# Patient Record
Sex: Female | Born: 1980 | Race: Black or African American | Hispanic: No | Marital: Single | Smoking: Never smoker
Health system: Southern US, Community
[De-identification: ages and names within clinical notes are randomized; demographics above are authoritative.]

## PROBLEM LIST (undated history)

## (undated) ENCOUNTER — Inpatient Hospital Stay (HOSPITAL_COMMUNITY): Payer: Self-pay

## (undated) DIAGNOSIS — D649 Anemia, unspecified: Secondary | ICD-10-CM

## (undated) DIAGNOSIS — D219 Benign neoplasm of connective and other soft tissue, unspecified: Secondary | ICD-10-CM

## (undated) DIAGNOSIS — R011 Cardiac murmur, unspecified: Secondary | ICD-10-CM

## (undated) DIAGNOSIS — O09219 Supervision of pregnancy with history of pre-term labor, unspecified trimester: Secondary | ICD-10-CM

## (undated) DIAGNOSIS — O24419 Gestational diabetes mellitus in pregnancy, unspecified control: Secondary | ICD-10-CM

## (undated) DIAGNOSIS — E119 Type 2 diabetes mellitus without complications: Secondary | ICD-10-CM

## (undated) DIAGNOSIS — I10 Essential (primary) hypertension: Secondary | ICD-10-CM

## (undated) HISTORY — DX: Benign neoplasm of connective and other soft tissue, unspecified: D21.9

## (undated) HISTORY — DX: Supervision of pregnancy with history of pre-term labor, unspecified trimester: O09.219

## (undated) HISTORY — DX: Anemia, unspecified: D64.9

## (undated) HISTORY — PX: NO PAST SURGERIES: SHX2092

## (undated) HISTORY — DX: Cardiac murmur, unspecified: R01.1

## (undated) HISTORY — DX: Morbid (severe) obesity due to excess calories: E66.01

---

## 2003-08-15 ENCOUNTER — Emergency Department (HOSPITAL_COMMUNITY): Admission: EM | Admit: 2003-08-15 | Discharge: 2003-08-16 | Payer: Self-pay

## 2003-09-08 ENCOUNTER — Inpatient Hospital Stay (HOSPITAL_COMMUNITY): Admission: AD | Admit: 2003-09-08 | Discharge: 2003-09-08 | Payer: Self-pay | Admitting: *Deleted

## 2003-09-22 ENCOUNTER — Inpatient Hospital Stay (HOSPITAL_COMMUNITY): Admission: AD | Admit: 2003-09-22 | Discharge: 2003-09-22 | Payer: Self-pay | Admitting: *Deleted

## 2003-09-23 ENCOUNTER — Inpatient Hospital Stay (HOSPITAL_COMMUNITY): Admission: AD | Admit: 2003-09-23 | Discharge: 2003-09-24 | Payer: Self-pay | Admitting: Obstetrics and Gynecology

## 2003-09-24 ENCOUNTER — Inpatient Hospital Stay (HOSPITAL_COMMUNITY): Admission: AD | Admit: 2003-09-24 | Discharge: 2003-09-24 | Payer: Self-pay | Admitting: Obstetrics and Gynecology

## 2003-10-02 ENCOUNTER — Inpatient Hospital Stay (HOSPITAL_COMMUNITY): Admission: RE | Admit: 2003-10-02 | Discharge: 2003-10-02 | Payer: Self-pay | Admitting: Obstetrics and Gynecology

## 2003-10-23 ENCOUNTER — Inpatient Hospital Stay (HOSPITAL_COMMUNITY): Admission: AD | Admit: 2003-10-23 | Discharge: 2003-10-23 | Payer: Self-pay | Admitting: Obstetrics & Gynecology

## 2004-01-07 ENCOUNTER — Inpatient Hospital Stay (HOSPITAL_COMMUNITY): Admission: AD | Admit: 2004-01-07 | Discharge: 2004-01-07 | Payer: Self-pay | Admitting: Obstetrics

## 2004-02-03 ENCOUNTER — Inpatient Hospital Stay (HOSPITAL_COMMUNITY): Admission: AD | Admit: 2004-02-03 | Discharge: 2004-02-03 | Payer: Self-pay | Admitting: Obstetrics

## 2004-02-07 ENCOUNTER — Inpatient Hospital Stay (HOSPITAL_COMMUNITY): Admission: AD | Admit: 2004-02-07 | Discharge: 2004-02-07 | Payer: Self-pay | Admitting: Obstetrics

## 2004-03-02 ENCOUNTER — Inpatient Hospital Stay (HOSPITAL_COMMUNITY): Admission: AD | Admit: 2004-03-02 | Discharge: 2004-03-02 | Payer: Self-pay | Admitting: Obstetrics

## 2004-04-19 ENCOUNTER — Inpatient Hospital Stay (HOSPITAL_COMMUNITY): Admission: AD | Admit: 2004-04-19 | Discharge: 2004-04-20 | Payer: Self-pay | Admitting: Obstetrics

## 2004-04-30 ENCOUNTER — Inpatient Hospital Stay (HOSPITAL_COMMUNITY): Admission: AD | Admit: 2004-04-30 | Discharge: 2004-05-02 | Payer: Self-pay | Admitting: Obstetrics

## 2004-05-04 ENCOUNTER — Inpatient Hospital Stay (HOSPITAL_COMMUNITY): Admission: AD | Admit: 2004-05-04 | Discharge: 2004-05-04 | Payer: Self-pay | Admitting: Obstetrics

## 2004-05-13 ENCOUNTER — Inpatient Hospital Stay (HOSPITAL_COMMUNITY): Admission: AD | Admit: 2004-05-13 | Discharge: 2004-05-15 | Payer: Self-pay | Admitting: *Deleted

## 2006-07-29 ENCOUNTER — Inpatient Hospital Stay (HOSPITAL_COMMUNITY): Admission: AD | Admit: 2006-07-29 | Discharge: 2006-07-29 | Payer: Self-pay | Admitting: Obstetrics and Gynecology

## 2006-08-03 ENCOUNTER — Inpatient Hospital Stay (HOSPITAL_COMMUNITY): Admission: AD | Admit: 2006-08-03 | Discharge: 2006-08-03 | Payer: Self-pay | Admitting: Obstetrics & Gynecology

## 2007-01-03 ENCOUNTER — Inpatient Hospital Stay (HOSPITAL_COMMUNITY): Admission: AD | Admit: 2007-01-03 | Discharge: 2007-01-19 | Payer: Self-pay | Admitting: Obstetrics

## 2007-01-31 ENCOUNTER — Inpatient Hospital Stay (HOSPITAL_COMMUNITY): Admission: AD | Admit: 2007-01-31 | Discharge: 2007-02-02 | Payer: Self-pay | Admitting: Obstetrics & Gynecology

## 2007-01-31 ENCOUNTER — Encounter: Payer: Self-pay | Admitting: Obstetrics & Gynecology

## 2007-07-02 ENCOUNTER — Emergency Department (HOSPITAL_COMMUNITY): Admission: EM | Admit: 2007-07-02 | Discharge: 2007-07-02 | Payer: Self-pay | Admitting: Emergency Medicine

## 2007-10-23 ENCOUNTER — Emergency Department (HOSPITAL_COMMUNITY): Admission: EM | Admit: 2007-10-23 | Discharge: 2007-10-24 | Payer: Self-pay | Admitting: Emergency Medicine

## 2007-11-18 ENCOUNTER — Emergency Department (HOSPITAL_COMMUNITY): Admission: EM | Admit: 2007-11-18 | Discharge: 2007-11-18 | Payer: Self-pay | Admitting: Emergency Medicine

## 2008-04-30 ENCOUNTER — Inpatient Hospital Stay (HOSPITAL_COMMUNITY): Admission: AD | Admit: 2008-04-30 | Discharge: 2008-04-30 | Payer: Self-pay | Admitting: Family Medicine

## 2008-06-11 ENCOUNTER — Ambulatory Visit: Payer: Self-pay | Admitting: Obstetrics and Gynecology

## 2008-06-25 ENCOUNTER — Emergency Department (HOSPITAL_COMMUNITY): Admission: EM | Admit: 2008-06-25 | Discharge: 2008-06-25 | Payer: Self-pay | Admitting: Emergency Medicine

## 2008-07-09 ENCOUNTER — Inpatient Hospital Stay (HOSPITAL_COMMUNITY): Admission: AD | Admit: 2008-07-09 | Discharge: 2008-07-09 | Payer: Self-pay | Admitting: Obstetrics & Gynecology

## 2008-08-08 ENCOUNTER — Inpatient Hospital Stay (HOSPITAL_COMMUNITY): Admission: AD | Admit: 2008-08-08 | Discharge: 2008-08-08 | Payer: Self-pay | Admitting: Obstetrics

## 2008-11-03 ENCOUNTER — Inpatient Hospital Stay (HOSPITAL_COMMUNITY): Admission: AD | Admit: 2008-11-03 | Discharge: 2008-11-04 | Payer: Self-pay | Admitting: Obstetrics

## 2008-11-26 ENCOUNTER — Inpatient Hospital Stay (HOSPITAL_COMMUNITY): Admission: AD | Admit: 2008-11-26 | Discharge: 2008-11-29 | Payer: Self-pay | Admitting: Obstetrics

## 2008-11-26 ENCOUNTER — Encounter: Payer: Self-pay | Admitting: Obstetrics

## 2008-12-29 ENCOUNTER — Inpatient Hospital Stay (HOSPITAL_COMMUNITY): Admission: AD | Admit: 2008-12-29 | Discharge: 2008-12-29 | Payer: Self-pay | Admitting: Obstetrics

## 2009-01-01 ENCOUNTER — Ambulatory Visit (HOSPITAL_COMMUNITY): Admission: RE | Admit: 2009-01-01 | Discharge: 2009-01-01 | Payer: Self-pay | Admitting: Obstetrics & Gynecology

## 2009-01-29 ENCOUNTER — Ambulatory Visit (HOSPITAL_COMMUNITY): Admission: RE | Admit: 2009-01-29 | Discharge: 2009-01-29 | Payer: Self-pay | Admitting: Obstetrics & Gynecology

## 2009-02-01 ENCOUNTER — Inpatient Hospital Stay (HOSPITAL_COMMUNITY): Admission: AD | Admit: 2009-02-01 | Discharge: 2009-02-04 | Payer: Self-pay | Admitting: Obstetrics

## 2009-02-09 ENCOUNTER — Inpatient Hospital Stay (HOSPITAL_COMMUNITY): Admission: AD | Admit: 2009-02-09 | Discharge: 2009-02-09 | Payer: Self-pay | Admitting: Obstetrics & Gynecology

## 2009-02-11 ENCOUNTER — Ambulatory Visit (HOSPITAL_COMMUNITY): Admission: RE | Admit: 2009-02-11 | Discharge: 2009-02-11 | Payer: Self-pay | Admitting: Obstetrics & Gynecology

## 2009-02-15 ENCOUNTER — Inpatient Hospital Stay (HOSPITAL_COMMUNITY): Admission: AD | Admit: 2009-02-15 | Discharge: 2009-02-19 | Payer: Self-pay | Admitting: Obstetrics & Gynecology

## 2009-02-16 ENCOUNTER — Encounter: Payer: Self-pay | Admitting: Obstetrics & Gynecology

## 2009-07-28 ENCOUNTER — Ambulatory Visit: Payer: Self-pay | Admitting: Family Medicine

## 2009-07-28 DIAGNOSIS — R143 Flatulence: Secondary | ICD-10-CM

## 2009-07-28 DIAGNOSIS — R142 Eructation: Secondary | ICD-10-CM

## 2009-07-28 DIAGNOSIS — D259 Leiomyoma of uterus, unspecified: Secondary | ICD-10-CM | POA: Insufficient documentation

## 2009-07-28 DIAGNOSIS — K047 Periapical abscess without sinus: Secondary | ICD-10-CM

## 2009-07-28 DIAGNOSIS — R141 Gas pain: Secondary | ICD-10-CM

## 2009-07-28 DIAGNOSIS — L910 Hypertrophic scar: Secondary | ICD-10-CM | POA: Insufficient documentation

## 2009-07-28 DIAGNOSIS — J309 Allergic rhinitis, unspecified: Secondary | ICD-10-CM | POA: Insufficient documentation

## 2009-07-28 DIAGNOSIS — R109 Unspecified abdominal pain: Secondary | ICD-10-CM | POA: Insufficient documentation

## 2009-07-28 DIAGNOSIS — I10 Essential (primary) hypertension: Secondary | ICD-10-CM | POA: Insufficient documentation

## 2009-07-29 ENCOUNTER — Encounter (INDEPENDENT_AMBULATORY_CARE_PROVIDER_SITE_OTHER): Payer: Self-pay | Admitting: Family Medicine

## 2009-08-20 ENCOUNTER — Ambulatory Visit: Payer: Self-pay | Admitting: Family Medicine

## 2009-08-20 LAB — CONVERTED CEMR LAB
Total CHOL/HDL Ratio: 4.5
VLDL: 28 mg/dL (ref 0–40)

## 2009-08-27 ENCOUNTER — Ambulatory Visit: Payer: Self-pay | Admitting: Family Medicine

## 2009-08-27 DIAGNOSIS — K089 Disorder of teeth and supporting structures, unspecified: Secondary | ICD-10-CM | POA: Insufficient documentation

## 2009-08-27 DIAGNOSIS — L259 Unspecified contact dermatitis, unspecified cause: Secondary | ICD-10-CM

## 2009-08-27 LAB — CONVERTED CEMR LAB: Hgb A1c MFr Bld: 5.6 %

## 2009-09-09 ENCOUNTER — Ambulatory Visit (HOSPITAL_COMMUNITY): Admission: RE | Admit: 2009-09-09 | Discharge: 2009-09-09 | Payer: Self-pay | Admitting: Family Medicine

## 2009-09-09 ENCOUNTER — Encounter (INDEPENDENT_AMBULATORY_CARE_PROVIDER_SITE_OTHER): Payer: Self-pay | Admitting: Family Medicine

## 2009-09-14 ENCOUNTER — Ambulatory Visit: Payer: Self-pay | Admitting: Internal Medicine

## 2009-10-20 ENCOUNTER — Ambulatory Visit: Payer: Self-pay | Admitting: Obstetrics & Gynecology

## 2009-10-20 ENCOUNTER — Encounter: Payer: Self-pay | Admitting: Family

## 2009-10-20 LAB — CONVERTED CEMR LAB
Platelets: 319 10*3/uL (ref 150–400)
TSH: 1.357 microintl units/mL (ref 0.350–4.500)
WBC: 6.1 10*3/uL (ref 4.0–10.5)

## 2009-10-21 ENCOUNTER — Encounter: Payer: Self-pay | Admitting: Physician Assistant

## 2009-10-28 ENCOUNTER — Ambulatory Visit: Payer: Self-pay | Admitting: Family Medicine

## 2009-10-29 ENCOUNTER — Ambulatory Visit (HOSPITAL_COMMUNITY): Admission: RE | Admit: 2009-10-29 | Discharge: 2009-10-29 | Payer: Self-pay | Admitting: Obstetrics & Gynecology

## 2009-11-19 ENCOUNTER — Ambulatory Visit: Payer: Self-pay | Admitting: Family Medicine

## 2009-12-04 ENCOUNTER — Emergency Department (HOSPITAL_COMMUNITY): Admission: EM | Admit: 2009-12-04 | Discharge: 2009-12-04 | Payer: Self-pay | Admitting: Emergency Medicine

## 2010-02-07 ENCOUNTER — Emergency Department (HOSPITAL_COMMUNITY): Admission: EM | Admit: 2010-02-07 | Discharge: 2010-02-07 | Payer: Self-pay | Admitting: Emergency Medicine

## 2010-08-09 ENCOUNTER — Emergency Department (HOSPITAL_COMMUNITY)
Admission: EM | Admit: 2010-08-09 | Discharge: 2010-08-10 | Payer: Self-pay | Source: Home / Self Care | Admitting: Emergency Medicine

## 2010-10-02 ENCOUNTER — Encounter: Payer: Self-pay | Admitting: *Deleted

## 2010-10-03 ENCOUNTER — Encounter: Payer: Self-pay | Admitting: Obstetrics

## 2010-10-11 NOTE — Letter (Signed)
Summary: DENTAL REFERRAL  DENTAL REFERRAL   Imported By: Arta Bruce 10/15/2009 14:49:21  _____________________________________________________________________  External Attachment:    Type:   Image     Comment:   External Document

## 2010-10-11 NOTE — Letter (Signed)
Summary: REFERRAL/DERMATOLOGY//APPT DATE & TIME  REFERRAL/DERMATOLOGY//APPT DATE & TIME   Imported By: Arta Bruce 10/12/2009 12:33:23  _____________________________________________________________________  External Attachment:    Type:   Image     Comment:   External Document

## 2010-11-22 LAB — URINE MICROSCOPIC-ADD ON

## 2010-11-22 LAB — URINALYSIS, ROUTINE W REFLEX MICROSCOPIC
Leukocytes, UA: NEGATIVE
Nitrite: NEGATIVE
Specific Gravity, Urine: 1.028 (ref 1.005–1.030)
Urobilinogen, UA: 0.2 mg/dL (ref 0.0–1.0)

## 2010-11-22 LAB — POCT I-STAT, CHEM 8
Creatinine, Ser: 0.6 mg/dL (ref 0.4–1.2)
Glucose, Bld: 99 mg/dL (ref 70–99)
Hemoglobin: 13.6 g/dL (ref 12.0–15.0)
Potassium: 3.7 mEq/L (ref 3.5–5.1)
TCO2: 25 mmol/L (ref 0–100)

## 2010-12-04 LAB — URINALYSIS, ROUTINE W REFLEX MICROSCOPIC
Glucose, UA: NEGATIVE mg/dL
Protein, ur: NEGATIVE mg/dL

## 2010-12-04 LAB — URINE MICROSCOPIC-ADD ON

## 2010-12-04 LAB — POCT PREGNANCY, URINE: Preg Test, Ur: NEGATIVE

## 2010-12-19 LAB — CBC
HCT: 35.1 % — ABNORMAL LOW (ref 36.0–46.0)
HCT: 36.2 % (ref 36.0–46.0)
HCT: 37.8 % (ref 36.0–46.0)
Hemoglobin: 12.6 g/dL (ref 12.0–15.0)
MCHC: 33 g/dL (ref 30.0–36.0)
MCHC: 33.2 g/dL (ref 30.0–36.0)
Platelets: 216 10*3/uL (ref 150–400)
Platelets: 220 10*3/uL (ref 150–400)
RDW: 16.1 % — ABNORMAL HIGH (ref 11.5–15.5)
RDW: 16.6 % — ABNORMAL HIGH (ref 11.5–15.5)
WBC: 7.8 10*3/uL (ref 4.0–10.5)

## 2010-12-19 LAB — COMPREHENSIVE METABOLIC PANEL
ALT: 23 U/L (ref 0–35)
ALT: 29 U/L (ref 0–35)
AST: 29 U/L (ref 0–37)
Albumin: 2.5 g/dL — ABNORMAL LOW (ref 3.5–5.2)
Albumin: 2.6 g/dL — ABNORMAL LOW (ref 3.5–5.2)
Alkaline Phosphatase: 70 U/L (ref 39–117)
Alkaline Phosphatase: 71 U/L (ref 39–117)
Alkaline Phosphatase: 83 U/L (ref 39–117)
BUN: 6 mg/dL (ref 6–23)
Calcium: 9 mg/dL (ref 8.4–10.5)
Chloride: 106 mEq/L (ref 96–112)
GFR calc Af Amer: >60 mL/min (ref >60)
GFR calc Af Amer: >60 mL/min (ref >60)
GFR calc non Af Amer: >60 mL/min (ref >60)
Glucose, Bld: 81 mg/dL (ref 70–99)
Potassium: 3.5 mEq/L (ref 3.5–5.1)
Potassium: 3.7 mEq/L (ref 3.5–5.1)
Sodium: 133 mEq/L — ABNORMAL LOW (ref 135–145)
Total Bilirubin: 0.4 mg/dL (ref 0.3–1.2)
Total Protein: 5.6 g/dL — ABNORMAL LOW (ref 6.0–8.3)
Total Protein: 5.8 g/dL — ABNORMAL LOW (ref 6.0–8.3)

## 2010-12-19 LAB — GLUCOSE, CAPILLARY
Glucose-Capillary: 100 mg/dL — ABNORMAL HIGH (ref 70–99)
Glucose-Capillary: 106 mg/dL — ABNORMAL HIGH (ref 70–99)

## 2010-12-19 LAB — MAGNESIUM: Magnesium: 3.6 mg/dL — ABNORMAL HIGH (ref 1.5–2.5)

## 2010-12-19 LAB — LACTATE DEHYDROGENASE: LDH: 125 U/L (ref 94–250)

## 2010-12-19 LAB — RPR: RPR Ser Ql: NONREACTIVE

## 2010-12-20 LAB — CBC
Hemoglobin: 12.3 g/dL (ref 12.0–15.0)
MCHC: 32.7 g/dL (ref 30.0–36.0)
MCV: 76.2 fL — ABNORMAL LOW (ref 78.0–100.0)
RBC: 4.92 MIL/uL (ref 3.87–5.11)

## 2010-12-20 LAB — COMPREHENSIVE METABOLIC PANEL
ALT: 25 U/L (ref 0–35)
Alkaline Phosphatase: 70 U/L (ref 39–117)
CO2: 23 mEq/L (ref 19–32)
Calcium: 9 mg/dL (ref 8.4–10.5)
GFR calc non Af Amer: >60 mL/min (ref >60)
Glucose, Bld: 71 mg/dL (ref 70–99)
Sodium: 135 mEq/L (ref 135–145)
Total Bilirubin: 0.3 mg/dL (ref 0.3–1.2)

## 2010-12-20 LAB — GLUCOSE, CAPILLARY
Glucose-Capillary: 126 mg/dL — ABNORMAL HIGH (ref 70–99)
Glucose-Capillary: 146 mg/dL — ABNORMAL HIGH (ref 70–99)
Glucose-Capillary: 164 mg/dL — ABNORMAL HIGH (ref 70–99)
Glucose-Capillary: 70 mg/dL (ref 70–99)
Glucose-Capillary: 81 mg/dL (ref 70–99)
Glucose-Capillary: 95 mg/dL (ref 70–99)

## 2010-12-20 LAB — LACTATE DEHYDROGENASE: LDH: 143 U/L (ref 94–250)

## 2010-12-20 LAB — PROTEIN, URINE, 24 HOUR
Collection Interval-UPROT: 24 hours
Protein, 24H Urine: 198 mg/d — ABNORMAL HIGH (ref 50–100)
Protein, Urine: 6 mg/dL
Urine Total Volume-UPROT: 3300 mL

## 2010-12-20 LAB — CREATININE CLEARANCE, URINE, 24 HOUR: Creatinine Clearance: 439 mL/min — ABNORMAL HIGH (ref 75–115)

## 2010-12-22 LAB — CBC
HCT: 30.2 % — ABNORMAL LOW (ref 36.0–46.0)
Hemoglobin: 11 g/dL — ABNORMAL LOW (ref 12.0–15.0)
Hemoglobin: 9.7 g/dL — ABNORMAL LOW (ref 12.0–15.0)
MCHC: 32.2 g/dL (ref 30.0–36.0)
MCHC: 32.5 g/dL (ref 30.0–36.0)
MCV: 75.9 fL — ABNORMAL LOW (ref 78.0–100.0)
RBC: 3.98 MIL/uL (ref 3.87–5.11)
RBC: 4.5 MIL/uL (ref 3.87–5.11)
RDW: 15.2 % (ref 11.5–15.5)
RDW: 15.6 % — ABNORMAL HIGH (ref 11.5–15.5)

## 2010-12-22 LAB — COMPREHENSIVE METABOLIC PANEL
ALT: 14 U/L (ref 0–35)
ALT: 16 U/L (ref 0–35)
AST: 22 U/L (ref 0–37)
Alkaline Phosphatase: 45 U/L (ref 39–117)
BUN: 2 mg/dL — ABNORMAL LOW (ref 6–23)
CO2: 23 mEq/L (ref 19–32)
Calcium: 8.2 mg/dL — ABNORMAL LOW (ref 8.4–10.5)
Calcium: 8.9 mg/dL (ref 8.4–10.5)
Creatinine, Ser: 0.42 mg/dL (ref 0.4–1.2)
GFR calc Af Amer: >60 mL/min (ref >60)
GFR calc non Af Amer: >60 mL/min (ref >60)
Glucose, Bld: 107 mg/dL — ABNORMAL HIGH (ref 70–99)
Potassium: 3 mEq/L — ABNORMAL LOW (ref 3.5–5.1)
Sodium: 135 mEq/L (ref 135–145)
Sodium: 137 mEq/L (ref 135–145)
Total Protein: 5.5 g/dL — ABNORMAL LOW (ref 6.0–8.3)
Total Protein: 5.9 g/dL — ABNORMAL LOW (ref 6.0–8.3)

## 2010-12-22 LAB — PROTEIN, URINE, 24 HOUR
Collection Interval-UPROT: 24 hours
Protein, 24H Urine: 172 mg/d — ABNORMAL HIGH (ref 50–100)
Protein, Urine: 4 mg/dL

## 2010-12-22 LAB — CREATININE CLEARANCE, URINE, 24 HOUR
Collection Interval-CRCL: 24 hours
Creatinine Clearance: 330 mL/min — ABNORMAL HIGH (ref 75–115)
Creatinine, 24H Ur: 2236 mg/d — ABNORMAL HIGH (ref 700–1800)
Urine Total Volume-CRCL: 4300 mL

## 2010-12-22 LAB — LACTATE DEHYDROGENASE: LDH: 106 U/L (ref 94–250)

## 2010-12-22 LAB — URIC ACID: Uric Acid, Serum: 3.9 mg/dL (ref 2.4–7.0)

## 2010-12-27 LAB — COMPREHENSIVE METABOLIC PANEL
ALT: 15 U/L (ref 0–35)
Alkaline Phosphatase: 38 U/L — ABNORMAL LOW (ref 39–117)
CO2: 23 mEq/L (ref 19–32)
GFR calc non Af Amer: >60 mL/min (ref >60)
Glucose, Bld: 89 mg/dL (ref 70–99)
Potassium: 3 mEq/L — ABNORMAL LOW (ref 3.5–5.1)
Sodium: 136 mEq/L (ref 135–145)

## 2010-12-27 LAB — CBC
HCT: 35.3 % — ABNORMAL LOW (ref 36.0–46.0)
Hemoglobin: 11.2 g/dL — ABNORMAL LOW (ref 12.0–15.0)
MCHC: 31.7 g/dL (ref 30.0–36.0)
RBC: 4.68 MIL/uL (ref 3.87–5.11)

## 2010-12-27 LAB — URINALYSIS, ROUTINE W REFLEX MICROSCOPIC
Nitrite: NEGATIVE
Protein, ur: NEGATIVE mg/dL
Urobilinogen, UA: 0.2 mg/dL (ref 0.0–1.0)

## 2010-12-27 LAB — URINE MICROSCOPIC-ADD ON

## 2010-12-27 LAB — LACTATE DEHYDROGENASE: LDH: 115 U/L (ref 94–250)

## 2011-01-24 NOTE — Group Therapy Note (Signed)
NAME:  Latasha Simmons, BALIK.:  [id number]   MEDICAL RECORD NO.:  [id number]          PATIENT TYPE:  WOC   LOCATION:  [facility]                   FACILITY:  [facility]   PHYSICIAN:  Argentina Donovan, MD        DATE OF BIRTH:  [birth year]   DATE OF SERVICE:                                  CLINIC NOTE   The patient is being seen today June 11, 2008, at [facility].  The patient's referral for maternity admissions secondary to  complaint of abdominal pain and amenorrhea.  The patient was evaluated  in maternity admissions on April 30, 2008, by nurse practitioner, Eveline Keto as a 30 year old with chief complaints of abdominal pain and  amenorrhea approximately 12 months.  The patient's examination was found  to be normal.  At that time and after consulting with Dr. Shawnie Pons, the  patient was started on Provera challenge and given instructions to  follow up in the GYN clinic.  The patient presents today after  completing a Provera challenge which did induce a period that started on  May 12, 2008, and lasted for approximately 5 days during which time  she said she did have heavy bleeding for 3 days requiring her to use  both the pad and a tampon, but it did not pass large clot.  She had some  minimal pain that was relieved by over-the-counter ibuprofen use.  The  patient presents today mainly because of this persistent amenorrhea to  date and also complaints of 25-pound weight gain over the last 12-16  months that is unexplained.  On question with the patient, she is a G2,  P1-1-0-2.  Her last child delivered approximately 16 months ago here at  the [facility].  She has had history of infertility with her last  pregnancy that was followed by Dr. Gaynell Face and she took Provera in  clinic to get pregnant.  Upon further questioning at this patient, the  patient had received a Mirena IUD after the birth of her child 16 months  ago which was removed  approximately 4 months ago after which she used  oral contraception pills for 2 months.  During this amount of time, the  patient has also been breast feeding her youngest child and only stopped  her breast feeding 2 months ago.  After a long discussion related to the  patient's use of contraceptions of the Mirena and oral contraceptive  pills, as well as her breast-feeding status, it is unclear whether the  origin of her amenorrhea state and is hopeful seeing that her periods  were easily produced using the Provera.  The patient also has complaint  of left ear pain today which we will examine.  On examination, the  patient's vital signs are stable.  She is an obese African female.  Examination is limited to her ear and abdomen.  Right ear with an  effusion.  Her abdomen is obese with irregular striae; however, is  nontender to palpation.  No masses; however, the examination is limited  secondary to her habitus.   ASSESSMENT AND PLAN:  1. Dysfunctional uterine bleeding likely secondary to recent  discontinuation of contraceptions and breast feeding.  We were      checking her TSH today and also having the patient to start basal      body temperature charting.  2. Acute otitis media of right ear.  Prescriptions given for Amoxil      875 mg b.i.d. for 7 days.  Clinical call the      patient with lab results.  The patient instructed to follow up in 3      months and encouraged to take prenatal vitamins daily.     ______________________________  Maylon Cos, CNM    ______________________________  Argentina Donovan, MD    SS/MEDQ  D:  06/11/2008  T:  06/12/2008  Job:  [id number]

## 2011-01-24 NOTE — Discharge Summary (Signed)
NAME:  Latasha Simmons, Latasha Simmons NO.:  [id number]   MEDICAL RECORD NO.:  [id number]          PATIENT TYPE:  INP   LOCATION:  9149                          [facility]   PHYSICIAN:  Roseanna Rainbow, M.D.DATE OF BIRTH:  06-06-1981   DATE OF ADMISSION:  11/26/2008  DATE OF DISCHARGE:  11/29/2008                               DISCHARGE SUMMARY   CHIEF COMPLAINT:  The patient is a 30 year old para 2 with an estimated  date of confinement of June 29 with an intrauterine pregnancy at 25 plus  weeks with a history of chronic hypertension, rule out superimposed  pregnancy-induced hypertension.  Please see the dictated history and  physical.   HOSPITAL COURSE:  The patient was admitted.  She was started on  magnesium sulfate parenterally for seizure prophylaxis.  She also  received a course of betamethasone.  Serial PIH labs were obtained and  were normal.  A 24-hour urine demonstrated 172 mg per day of protein  with creatinine clearance of 330.  A complete obstetrical ultrasound  showed a normal amniotic fluid index with an estimated fetal weight at  the 56th percentile .  A consultation was obtained from Dr. Katrinka Blazing from  the [facility] who agreed with the plan of care.  She was  also started on p.o. labetalol.  As her initial blood pressures were  labile, she received the dose of parenteral labetalol as well.  She  received this on the day of admission.  Subsequently, her blood  pressures were adequately controlled on the p.o. labetalol regimen.  She  developed a headache on hospital day #1, that was controlled with p.o.  analgesics and resolved.  The fetal heart tracing remained reassuring  during the hospitalization.  Prior to discharge to home, [facility] was [facility] and again was in agreement with continued  management on an outpatient basis.   DISCHARGE DIAGNOSES:  Intrauterine pregnancy at 26 plus weeks, chronic  hypertension.   CONDITION:  Stable.   DIET:  Regular.   ACTIVITY:  Decreased activity.   MEDICATIONS:  Continue the prenatal vitamins, amoxicillin.  A  prescription was also given for labetalol 200 mg tabs 2 tabs p.o. b.i.d.   DISPOSITION:  The patient was to follow up in the office in several  days.      Roseanna Rainbow, M.D.  Electronically Signed     LAJ/MEDQ  D:  11/29/2008  T:  11/29/2008  Job:  [id number]

## 2011-01-24 NOTE — Op Note (Signed)
NAME:  Latasha Simmons, REACH NO.:  [id number]   MEDICAL RECORD NO.:  [id number]          PATIENT TYPE:  OUT   LOCATION:  [facility]                          [facility]   PHYSICIAN:  Roseanna Rainbow, M.D.DATE OF BIRTH:  [birth year]   DATE OF PROCEDURE:  02/11/2009  DATE OF DISCHARGE:  02/11/2009                               OPERATIVE REPORT   PROCEDURE:  Amniocentesis.   PROCEDURE NOTE:  The abdomen was prepped and draped.  Using a 22-gauge  spinal needle, an amniocentesis was performed using ultrasound guidance.  7 mL of clear fluid was aspirated.  The fetal heart rate was obtained  post procedure.  The procedure was well tolerated.      Roseanna Rainbow, M.D.  Electronically Signed     LAJ/MEDQ  D:  02/11/2009  T:  02/11/2009  Job:  [id number]

## 2011-01-24 NOTE — H&P (Signed)
NAME:  Latasha Simmons, Latasha Simmons NO.:  [id number]   MEDICAL RECORD NO.:  [id number]          PATIENT TYPE:  INP   LOCATION:  9163                          [facility]   PHYSICIAN:  Roseanna Rainbow, M.D.DATE OF BIRTH:  27-Jul-1981   DATE OF ADMISSION:  01/31/2007  DATE OF DISCHARGE:                              HISTORY & PHYSICAL   CHIEF COMPLAINT:  The patient is a 30 year old gravida 2, para 1 with an  estimated date of confinement of February 20, 2007 with an intrauterine  pregnancy at 37 weeks for induction of labor secondary to pregnancy  induced hypertension.   HISTORY OF PRESENT ILLNESS:  Please see the above.  The patient had been  hospitalized in the last month with mild preeclampsia by 24-hour urine  protein--values of approximately 300 mg per day.  The patient also has a  history of chronic hypertension.   ALLERGIES:  No known drug allergies.   MEDICATIONS:  Please see the medication reconciliation form.   OB RISK FACTORS:  Please see the above.  Uterine fibroid.   PRENATAL SCREENING:  Urine culture and sensitivity, no growth.  One hour  G.T.T. 134.  Pap smear negative.  Hepatitis B surface antigen negative.  Hematocrit 35, hemoglobin 11.4.  HIV nonreactive.  Platelets 384,000.  Blood type A+.  Antibody screen negative.  RPR nonreactive.  Rubella  immune.  Sickle cell negative.  GBS positive.   PAST GYN HISTORY:  Noncontributory.   PAST MEDICAL HISTORY:  No significant history of medical diseases.   PAST SURGICAL HISTORY:  She denies.   SOCIAL HISTORY:  She is a Futures trader.  Single.  Does not give any  significant history of alcohol usage.  Has no significant smoking  history.  Denies illicit drug use.   FAMILY HISTORY:  No major illnesses.   PAST OBSTETRICAL HISTORY:  In September, 2005, she delivered a live born  female, full term, 6 pounds, 7 ounces, vaginal delivery.   PHYSICAL EXAMINATION:  VITAL SIGNS:  Stable.  Afebrile.  GENERAL:   Well-developed and well-nourished.  No apparent distress.  ABDOMEN:  Gravid, nontender.  PELVIC:  Sterile vaginal exam:  The cervix is +2 and 80%, per the RN.  Fetal heart tracing, reassuring tocodynamometry.  Regular uterine  contractions.   ASSESSMENT:  Intrauterine pregnancy at 37 weeks with mild preeclampsia  superimposed onto chronic hypertension.  GBS positive.  Fetal heart  tracing consistent with fetal well-being.  Favorable Bishop score.   PLAN:  Admission.  Induction of labor.  Will start with low dose  Pitocin, to be followed by artificial rupture of membranes.  Penicillin.  GBS prophylaxis, per protocol.      Roseanna Rainbow, M.D.  Electronically Signed     LAJ/MEDQ  D:  01/31/2007  T:  01/31/2007  Job:  [id number]

## 2011-01-24 NOTE — H&P (Signed)
NAME:  Latasha Simmons, Latasha Simmons NO.:  [id number]   MEDICAL RECORD NO.:  [id number]          PATIENT TYPE:  INP   LOCATION:  9149                          [facility]   PHYSICIAN:  Roseanna Rainbow, M.D.DATE OF BIRTH:  1981-07-05   DATE OF ADMISSION:  11/26/2008  DATE OF DISCHARGE:                              HISTORY & PHYSICAL   CHIEF COMPLAINT:  The patient is a 30 year old para 2 with an estimated  date of confinement of June 29 by ultrasound with an intrauterine  pregnancy at 25+ weeks with a history of chronic hypertension, rule out  superimposed of PIH.   HISTORY OF PRESENT ILLNESS:  The patient has a history of chronic  hypertension.  Her blood pressures had been in the 120s to 140s over 80s  to 90 range.  She is not currently on any antihypertensive medications.  She denied any neurological symptoms at her visit in the office earlier  today.  Her blood pressures in the office were 160s over 80s to 90s.  Also a urine dip was remarkable for 2+ protein and 2+ red blood cells.   ALLERGIES:  No known drug allergies.   MEDICATIONS:  Please see the medication reconciliation form.   OB RISK FACTORS:  Please see the above.  There is also a history of  uterine fibroids.   PRENATAL LABS:  Blood type A positive, antibody screen negative.  Urine  culture and sensitivity no growth.  Chlamydia probe negative.  Pap smear  negative.  GC probe negative.  Hepatitis B surface antigen negative.  Hematocrit 38.2, hemoglobin 12.2.  HIV nonreactive.  Platelets 405,000.  Rubella immune.  Sickle cell negative.   Ultrasound on November 28:  There was a crown-rump length of 10 weeks 1  day.  There was an ultrasound on October 29 that had crown-rump length  of 5 weeks 5 days.  On February 4, there was an ultrasound performed at  19 weeks 6 days.  There was noted a marginal insertion on the umbilical  cord as well as a 7.8-cm anterior uterine body fibroid.   PAST GYN HISTORY:   Noncontributory.   PAST MEDICAL HISTORY:  Please see the above.  Urinary tract infections.   PAST SURGICAL HISTORY:  No previous surgeries.   SOCIAL HISTORY:  She is a Interior and spatial designer.  She is engaged to be married,  does not give any significant history of alcohol usage and has no  significant smoking history.  Stress issues include financial  difficulties.  She denies illicit drug use.   FAMILY HISTORY:  Remarkable for adult-onset diabetes.   PAST OBSTETRICAL HISTORY:  In September 2005 she was delivered of a live  born female at 7 weeks, vaginal delivery.  In May 2008, she was delivered  of a live born female at 36 weeks, 6 pounds 11 ounces, vaginal delivery.  This pregnancy was complicated by pregnancy-induced hypertension and  that was the indication for the induction at 36 weeks.   REVIEW OF SYSTEMS:  NEUROLOGICAL:  She denies any headaches, visual  disturbances.  GI: She denies any epigastric pain, nausea, vomiting.  PULMONARY:  She denies any shortness of breath.  PHYSICAL EXAMINATION:  VITAL SIGNS:  On physical exam in the office, the  initial blood pressure was 161/85, one repeat 169/96.  Urine dip 2+  protein, 2+ red blood cells.  Fetal heart rate by Doppler 160 beats per  minute.  GENERAL:  No apparent distress.  ABDOMEN:  Gravid and nontender.  PELVIC:  Deferred.  EXTREMITIES:  Trace to 1+ pretibial edema bilaterally.   ASSESSMENT:  Intrauterine pregnancy at 25+ weeks with a history of  chronic hypertension, rule out superimposed pregnancy-induced  hypertension.   PLAN:  Admission.  Will check a PIH panel.  We will also obtain a  complete obstetrical ultrasound as well as maternal/fetal medicine  consultation, heightened maternal and fetal surveillance, bed rest.      Roseanna Rainbow, M.D.  Electronically Signed     LAJ/MEDQ  D:  11/26/2008  T:  11/26/2008  Job:  [id number]

## 2011-01-24 NOTE — Discharge Summary (Signed)
NAME:  Latasha Simmons, Latasha Simmons NO.:  [id number]   MEDICAL RECORD NO.:  [id number]          PATIENT TYPE:  INP   LOCATION:  9156                          [facility]   PHYSICIAN:  Roseanna Rainbow, M.D.DATE OF BIRTH:  14-Jun-1981   DATE OF ADMISSION:  02/01/2009  DATE OF DISCHARGE:  02/04/2009                               DISCHARGE SUMMARY   CHIEF COMPLAINT:  The patient is a 31 year old para 2 with an estimated  date of confinement of March 09, 2009, with an intrauterine pregnancy at  34.6 weeks with a history of chronic hypertension, now with rule-out  superimposed pregnancy-induced hypertension.  Please see the dictated  history and physical.   HOSPITAL COURSE:  The patient was admitted.  Dr. Katrinka Blazing was consulted  from [facility], who agreed with the initial assessment.  Recommendations included watching for signs and symptoms of severe  preeclampsia.  It was also recommended that if the 24-hour urine was  negative for significant proteinuria, the antihypertensive regimen could  be titrated to optimize blood pressure control.  Initial laboratory  data: hemoglobin 12.3 and platelets 242,000.  Uric acid 3.6.  The  remainder of the [facility] panel was normal.  The patient also remained  euglycemic.  Her blood pressures were well controlled.  On Feb 02, 2009,  a 24-hour urine showed 198 mg of protein and normal creatinine  clearance.  She was subsequently discharged to home on Feb 04, 2009.   DISCHARGE DIAGNOSIS:  1. Intrauterine pregnancy at 36+ weeks.  2. Chronic hypertension.  3. Gestational diabetes.   CONDITION:  Stable.   DIET:  2000 KCal ADA diet.   MEDICATIONS:  1. Labetalol 200 mg 2 tablets 3 times per day.  2. Norvasc 5 mg p.o. once daily.   ACTIVITY:  Modified bed rest.   DISPOSITION:  The patient was scheduled for induction of labor, the  following Thursday at 7:30 p.m.  An amniocentesis for fetal lung  maturity was scheduled on February 11, 2009 at 8:00 a.m.      Roseanna Rainbow, M.D.  Electronically Signed     LAJ/MEDQ  D:  03/02/2009  T:  03/02/2009  Job:  [id number]

## 2011-01-24 NOTE — Discharge Summary (Signed)
NAME:  Latasha Simmons, Latasha Simmons NO.:  [id number]   MEDICAL RECORD NO.:  [id number]          PATIENT TYPE:  INP   LOCATION:  9114                          [facility]   PHYSICIAN:  Roseanna Rainbow, M.D.DATE OF BIRTH:  [birth year]   DATE OF ADMISSION:  02/15/2009  DATE OF DISCHARGE:  02/19/2009                               DISCHARGE SUMMARY   CHIEF COMPLAINT:  The patient is a 30 year old para 2 with an estimated  date of confinement of March 09, 2009, and intrauterine pregnancy at 36.6  weeks with a history of chronic hypertension now with likely  superimposed pregnancy-induced hypertension.  Please see the dictated  history and physical for further details.   HOSPITAL COURSE:  The patient was admitted, the cervix was ripened with  Cervidil and magnesium sulfate was started parenterally for seizure  prophylaxis.  A consult was obtained from Dr. Margot Ables of [facility] who was in agreement with the assessment and plan for delivery.  After the Cervidil was removed, low-dose Pitocin was started per  protocol.  She progressed in labor and delivered precipitously on June  8.  She was delivered of a live born female, weight was 6 pounds 6  ounces, Apgars were 4 and 9 at 1 and 10 minutes respectively.  Neonatology had been called.  The infant was transferred to [facility].  The placenta was removed with assistance of periurethral tear  and second-degree perineal lacerations repaired with 2-0 nylon 3-0  Vicryl Rapide sutures.  The estimated blood loss was less than 500 mL.  The patient was then transferred to the AICU for continued magnesium  sulfate, seizure prophylaxis.  She remained stable.  The magnesium  sulfate was discontinued.  She was then transferred to the mother-baby  unit.  Her blood pressures remained stable and she was discharged to  home on postpartum day #3.   DISCHARGE DIAGNOSES:  Intrauterine pregnancy at 36.6 weeks, chronic  hypertension, superimposed severe pregnancy-induced hypertension,  gestational diabetes, on an oral agent.   PROCEDURE:  Vaginal delivery.   CONDITION:  Stable.   DIET:  Regular.   ACTIVITY:  Pelvic rest, progressive activity.   MEDICATIONS:  Labetalol and ibuprofen.   DISPOSITION:  The patient was to follow up in the office in 3 days for  blood pressure check.      Roseanna Rainbow, M.D.  Electronically Signed     LAJ/MEDQ  D:  03/02/2009  T:  03/02/2009  Job:  [id number]

## 2011-01-24 NOTE — H&P (Signed)
NAME:  Latasha Simmons, Latasha Simmons NO.:  [id number]   MEDICAL RECORD NO.:  [id number]          PATIENT TYPE:  INP   LOCATION:  9167                          [facility]   PHYSICIAN:  Roseanna Rainbow, M.D.DATE OF BIRTH:  06-05-1981   DATE OF ADMISSION:  02/15/2009  DATE OF DISCHARGE:                              HISTORY & PHYSICAL   CHIEF COMPLAINT:  The patient is a 30 year old para 2 with an estimated  date of confinement of March 09, 2009, by an ultrasound with an  intrauterine pregnancy at 36.6 weeks with history of chronic  hypertension, rule out superimposed DIH.   HISTORY OF PRESENT ILLNESS:  The patient has a history of chronic  hypertension.  She is currently on 2 antihypertensive medications.  She  has had several admissions during the pregnancy for labile blood  pressures.  Blood pressure readings in the office earlier today, the  blood pressures were 160s over 90s to 100.  She denied any neurological  symptoms.  There was no proteinuria on a urine dip.  The patient is  status post an amniocentesis for fetal lung maturity on February 11, 2009.  The LS ratio was 1.8 and there was no PG.  An ultrasound on Jan 29, 2009, the amniotic fluid index was 18.5, the estimated fetal weight was  at 57th percentile for a 35-week gestation.   ALLERGIES:  No known drug allergies.   MEDICATIONS:  Please see the medication reconciliation form.   OBSTETRIC RISK FACTORS:  Gestational diabetes on an oral agent, uterine  fibroids, please see the above.   PRENATAL LABORATORY DATA:  Blood type A+, antibody screen negative.  Urine culture and sensitivity no growth.  Chlamydia probe negative.  Pap  smear negative.  GC probe negative.  Hepatitis B surface antigen  negative.  Hematocrit 38.2, hemoglobin 12.2, HIV nonreactive, platelets  405,000, rubella immune, sickle cell negative.  Ultrasound on August 08, 2009, revealed a crown-lump length of 10 weeks 1 day.  There was an  ultrasound on July 09, 2009, that had a crown-lump length of 5 weeks  5 days.  On October 15, 2008, there was an ultrasound performed at 19  weeks 6 days.  There was a marginal insertion on the umbilical cord as  well.  There is a 7.8-cm anterior uterine body fibroid.  Group beta  strep negative on February 09, 2009.   PAST GYNECOLOGIC HISTORY:  Noncontributory.   PAST MEDICAL HISTORY:  There is a history of urinary tract infections.   PAST SURGICAL HISTORY:  No previous surgery.   SOCIAL HISTORY:  She is a Producer, television/film/video.  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  Does not give any significant history of alcohol usage and has no  significant smoking history.  Stress issues include financial  difficulties.  She denies illicit drug use.   FAMILY HISTORY:  Remarkable for adult-onset diabetes mellitus.   PAST OBSTETRICAL HISTORY:  In September 2005, she was delivered of a  live born female at 47 weeks via vaginal delivery.  In May 2008, she was  delivered of a live born female at 36 weeks, 6 pounds 11 ounces  vaginal  delivery.  The pregnancy was complicated by pregnancy-induced  hypertension and that was the indication for induction at 36 weeks.   REVIEW OF SYSTEMS:  NEUROLOGICAL:  She denies any headaches, visual  disturbances.  GASTROINTESTINAL:  She denies any epigastric pain, nausea, or vomiting.  PULMONARY:  She denies any shortness of breath.   PHYSICAL EXAMINATION:  The initial blood pressures in the office 164/90,  on repeat 164/100.  Urine dip trace protein.  Fetal heart tracing.  Nonstress test fetal heart rate baseline 160s minimally reactive, no  decelerations.  Tocodynamometer, no uterine contractions.  GENERAL:  No apparent distress.  ABDOMEN:  Gravid, nontender.  PELVIC:  The cervix is fingertip long, the vertex at -3 station.  EXTREMITIES:  Trace to 1+ pretibial edema bilaterally.   ASSESSMENT:  Intrauterine pregnancy at 36 plus 6 weeks with a history of  chronic hypertension, rule  out superimposed pregnancy-induced  hypertension, gestational diabetes on an oral agent.   PLAN:  Admission, magnesium sulfate seizure prophylaxis, 2-stage  induction of labor.      Roseanna Rainbow, M.D.  Electronically Signed     LAJ/MEDQ  D:  02/15/2009  T:  02/16/2009  Job:  [id number]

## 2011-01-24 NOTE — H&P (Signed)
NAME:  Latasha Simmons, AZIMI NO.:  [id number]   MEDICAL RECORD NO.:  [id number]          PATIENT TYPE:  INP   LOCATION:  9156                          FACILITY:  [facility]   PHYSICIAN:  Roseanna Rainbow, M.D.DATE OF BIRTH:  09-Nov-1980   DATE OF ADMISSION:  02/01/2009  DATE OF DISCHARGE:                              HISTORY & PHYSICAL   CHIEF COMPLAINT:  The patient is a 30 year old para 2 with an estimated  date of confinement of June 29th, with an intrauterine pregnancy at 34.6  weeks with a history of chronic hypertension rule out superimposed  pregnancy-induced hypertension.   HISTORY OF PRESENT ILLNESS:  Please see the above.  The patient was  hospitalized in March of 2010 with elevated blood pressures.  The  patient's antihypertensive regimen now consists of Labetalol.  Despite  continuing to titrate her medications, her blood pressure in the office  earlier today was in the 160s/100 range.  She denied any neurological  symptoms.  There is no proteinuria on a urine dip in the office.  A  nonstress test was reassuring.   ALLERGIES:  No known drug allergies.   MEDICATIONS:  Please see the medication reconciliation form.   OB RISK FACTORS:  Please see the above.  There is also a history of  uterine fibroids, gestational diabetes on an oral agent.   PRENATAL LABS:  Blood type is A+, antibody screen negative.  Urine  culture and sensitivity no growth.  Chlamydia probe negative.  Pap smear  negative.  GC probe negative.  Hepatitis B surface antigen negative.  Hematocrit 38.2, hemoglobin 12.2.  HIV nonreactive.  Platelets 405,000.  Rubella immune.  Sickle cell negative.  A 1-hour GTT 166, 3-hour GTT  fasting 112, 1-hour 231, 2-hour 214, 3-hour 163.  Ultrasound November  28th:  Crown rump length 10 weeks 1 day.  Ultrasound October 29th:  Crown rump length 5 weeks 5 days.  Ultrasound on February 14th:  19  weeks 6 days, marginal umbilical cord insertion and a 7.8-cm  anterior  uterine body fibroid.  An ultrasound on March 18th at 26 weeks 1 day,  the estimated fetal weight was at the 56th percentile, normal amniotic  fluid index.  An ultrasound on May 23rd, at 31 weeks 2 days, the  estimated fetal weight was at the 55th percentile with a normal amniotic  fluid index, a BPP was 8/8.   PAST GYN HISTORY:  Noncontributory.   PAST MEDICAL HISTORY:  Please see the above.  Urinary tract infections.   PAST SURGICAL HISTORY:  No previous surgery.   SOCIAL HISTORY:  She is a Interior and spatial designer.  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  She does not give any significant history of alcohol usage.  She has no  significant smoking history.  Stress issues include financial  difficulties.  She denies illicit drug use.   FAMILY HISTORY:  Remarkable for adult-onset diabetes.   PAST OBSTETRICAL HISTORY:  In September 2005 she was delivered of a live-  born female at 81 weeks via vaginal delivery.  In May 2008 she was  delivered of a live-born female at 7 weeks, the birth weight was 6  pounds  11 ounces, vaginal delivery.  This pregnancy was complicated by  pregnancy-induced hypertension which was the indication for the  induction.   REVIEW OF SYSTEMS:  NEUROLOGICAL:  She denies headaches, visual  disturbances.  GI: She denies any epigastric pain, nausea, vomiting.  PULMONARY:  She denies any shortness of breath.   PHYSICAL EXAMINATION:  VITAL SIGNS:  Blood pressure 162/100.  Urine dip  stick negative protein.  Nonstress test reassuring.  GENERAL:  No apparent distress.  ABDOMEN:  Gravid, nontender.  PELVIC:  The cervix is 1 cm to 2 cm dilated, thick, with the presenting  part at a -3 station.  EXTREMITIES:  Pretibial edema bilaterally 2+.   ASSESSMENT:  Intrauterine pregnancy at 34.6 weeks with a history of  chronic hypertension, now rule out superimposed pregnancy-induced  hypertension.  Also with a history of gestational diabetes, currently on  an oral agent.   PLAN:   Admission.  Will check a PIH panel.  Will also obtain a 24-hour  urine for protein and creatinine, continue antihypertensive medications  as well as the oral hypoglycemic agents, bedrest, maternal fetal  medicine consultation.  We will also collect a 24-hour urine for protein  and creatinine.      Roseanna Rainbow, M.D.  Electronically Signed     LAJ/MEDQ  D:  02/01/2009  T:  02/01/2009  Job:  [id number]

## 2011-01-27 NOTE — Op Note (Signed)
NAME:  Latasha Simmons, Latasha Simmons NO.:  [id number]   MEDICAL RECORD NO.:  [id number]                   PATIENT TYPE:  INP   LOCATION:  9107                                 FACILITY:  [facility]   PHYSICIAN:  Kathreen Cosier, M.D.           DATE OF BIRTH:  1981/01/15   DATE OF PROCEDURE:  05/13/2004  DATE OF DISCHARGE:  05/15/2004                                 OPERATIVE REPORT   PROCEDURE:  Delivery.   SURGEON:  Kathreen Cosier, M.D.   INDICATIONS:  The patient is a prima gravida at term, fully dilated, and she  was at +3 station with prolonged variable decelerations.   DESCRIPTION OF PROCEDURE:  A midline episiotomy was performed and a Mityvac  applied.  Pressure was applied during one contraction, and delivery was  affected.  The infant was delivered from the ROA position.  There was a  nuchal cord x1.  This was cut prior to delivery of the shoulders.  She had a  female, Apgars 8 and 9.  There was a third degree extension.  The extension  was repaired in layers with 2-0 Vicryl suture.  The episiotomy was repaired  in the usual manner.  The placenta was delivered spontaneously.                                               Kathreen Cosier, M.D.    BAM/MEDQ  D:  05/13/2004  T:  05/16/2004  Job:  [id number]

## 2011-01-27 NOTE — Discharge Summary (Signed)
NAME:  Latasha Simmons, Latasha Simmons NO.:  [id number]   MEDICAL RECORD NO.:  [id number]          PATIENT TYPE:  INP   LOCATION:  9158                          [facility]ACILITY:  [facility]   PHYSICIAN:  Charles A. Clearance Coots, M.D.DATE OF BIRTH:  Jul 04, 1981   DATE OF ADMISSION:  01/03/2007  DATE OF DISCHARGE:  01/19/2007                               DISCHARGE SUMMARY   ADMISSION DIAGNOSIS:  [redacted] weeks gestation, chronic hypertension.   DISCHARGE DIAGNOSIS:  [redacted] weeks gestation, chronic hypertension.  Status  post bed rest and supportive therapy, discharged home undelivered at [redacted]  weeks gestation in good condition.   REASON FOR ADMISSION:  30 year old G2, P78, estimated date of confinement  February 20, 2007 history of elevated blood pressures in the office admitted  to the hospital for bedrest and blood pressure management.  The patient  has had a history of chronic hypertension and has been managed with  Aldomet during pregnancy.  Her care has also been managed  collaboratively with [facility].  She denied any  neurological symptoms on the day of presentation in the office.  Her  blood pressures were slightly labile with blood pressures ranging  systolics of 140-160 over diastolic of 60-70 with 1+ protein noted on  her urine dip.   PAST MEDICAL HISTORY:  Surgery none.  Illnesses; chronic hypertension.   MEDICATIONS:  Prenatal vitamins, Aldomet, Claritin.   ALLERGIES:  NO KNOWN DRUG ALLERGIES.   SOCIAL HISTORY:  Homemaker, single.  Negative history of tobacco,  alcohol or recreational drug use.   FAMILY HISTORY:  No major illnesses known.   OBSTETRICAL RISK FACTORS:  Uterine fibroids and chronic hypertension.   PHYSICAL EXAMINATION:  GENERAL:  Well-nourished, well-developed female  in no acute distress.  VITAL SIGNS:  Blood pressure 158/64, she is afebrile.  LUNGS:  Clear to auscultation bilaterally.  HEART: Regular rate and rhythm.  ABDOMEN:  Gravid, nontender.  PELVIC:   Cervix long, closed.  EXTREMITIES:  Trace to 1+ pretibial edema, deep tendon reflexes were 1-  2+ bilaterally.   IMPRESSION:  [redacted] weeks gestation with chronic hypertension, rule out  superimposed pregnancy induced hypertension.   PLAN:  Admit, bedrest.  Continue antihypertensive medication, start 24-  hour urine, checked PIH labs.   ADMITTING LABORATORY:  Values hemoglobin 11, hematocrit 33, white blood  cell count 8900, platelets of 268,000.  Comprehensive metabolic panel  was within normal limits.   HOSPITAL COURSE:  The patient was admitted and placed on bedrest and  continued on antihypertensive therapy.  She responded well to bedrest  and medication. Her 24-hour urine was within normal limits with total  protein of 300 mg over 24 hours and creatinine clearance of 381 mL per  minute. The patient continued to do well on bedrest and had really very  stable blood pressures after being on bedrest and antihypertensive  therapy.  The patient was ready for discharge on Jan 19, 2007 with  stable blood pressures and she was therefore discharged home undelivered  at [redacted] weeks gestation on Jan 19, 2007.   DISCHARGE LABORATORY:  Hemoglobin 11.3, hematocrit 35, white blood cell  count 10,000, platelets 278,000. Comprehensive metabolic panel  was  within normal limits.   DISCHARGE DISPOSITION:  Continue prenatal vitamins.  Continue on  antihypertensive therapy as prescribed, continue bedrest at home, follow-  up in the office in 1 week. Precautions were given for preeclampsia and  the patient is to report any severe headaches, visual changes.      Charles A. Clearance Coots, M.D.  Electronically Signed     CAH/MEDQ  D:  03/06/2007  T:  03/06/2007  Job:  [id number]

## 2011-01-27 NOTE — H&P (Signed)
NAME:  Latasha Simmons, LINGENFELTER NO.:  [id number]   MEDICAL RECORD NO.:  [id number]          PATIENT TYPE:  INP   LOCATION:  9161                          [facility]ACILITY[facility]  [facility]   PHYSICIAN:  Roseanna Rainbow, M.D.DATE OF BIRTH:  1980-09-23   DATE OF ADMISSION:  01/03/2007  DATE OF DISCHARGE:                              HISTORY & PHYSICAL   CHIEF COMPLAINT:  The patient is a 30 year old gravida 2, para 1 with an  estimated date of confinement of June 11 with an intrauterine pregnancy  of 33+ weeks with a history of chronic hypertension.  Rule out  superimposed preeclampsia.   HISTORY OF PRESENT ILLNESS:  The patient has a history of chronic  hypertension and has been managed with Aldomet.  Her care has also been  managed collaboratively with [facility].  She denies any  neurological symptoms on presentation today in the office.  Her blood  pressures were slightly labile with a blood pressure ranging in the 140s  to 160s over 60s to 70s with 1+ protein noted on her urine dip.   PAST GYNECOLOGICAL HISTORY:  Noncontributory.   PAST MEDICAL HISTORY:  Chronic hypertension.   PAST SURGICAL HISTORY:  She denies.   SOCIAL HISTORY:  She is a homemaker, single.  Does not give any  significant history of alcohol usage.  Has no significant smoking  history.   FAMILY HISTORY:  No major illnesses known.   MEDICATIONS:  See the reconciliation form.   ALLERGIES:  No known drug allergies.   OBSTETRIC RISK FACTORS:  Uterine fibroids, chronic hypertension.   OBSTETRIC LABORATORIES:  PIH labs on September 25, 2006 normal.  Urine  culture and sensitivity insignificant growth.  A 24-hour urine  creatinine clearance 290 on January 15.  Pap smear negative.  One hour  GCT 134.  Hepatitis B surface antigen negative.  Hematocrit 33.8,  hemoglobin 10.6.  HIV nonreactive.  Platelets 282,000.  The 24-hour  urine protein on January 15 was 240 mg/dL.  Blood type is A positive,  antibody screen negative.  Sickle cell negative.   An ultrasound on January 10 at 18 weeks normal amniotic fluid, no  previa, normal level 2 anatomy, multiple uterine fibroids.   PHYSICAL EXAMINATION:  VITAL SIGNS:  Initial blood pressure 158/64,  repeat blood pressure 148/72, fetal heart tones by Doppler 130s.  ABDOMEN:  Fundal height 32 cm, sterile vaginal exam deferred.  EXTREMITIES:  Trace to 1+ pretibial edema.   ASSESSMENT:  Intrauterine pregnancy at 33+ weeks with history of chronic  hypertension.  Rule out superimposed pregnancy-induced hypertension.   PLAN:  Admission.  Repeat PIH lab panel, 24-hour urine protein,  ultrasound for growth, bed rest.  Continue her antihypertensive regimen.      Roseanna Rainbow, M.D.  Electronically Signed     LAJ/MEDQ  D:  01/03/2007  T:  01/03/2007  Job:  [id number]

## 2011-01-27 NOTE — Discharge Summary (Signed)
NAME:  Latasha Simmons, Latasha Simmons NO.:  [id number]   MEDICAL RECORD NO.:  [id number]          PATIENT TYPE:  INP   LOCATION:  9158                          [facility]ILITY[facility]   PHYSICIAN:  Charles A. Clearance Coots, M.D.DATE OF BIRTH:  02-16-1981   DATE OF ADMISSION:  01/31/2007  DATE OF DISCHARGE:  02/02/2007                               DISCHARGE SUMMARY   ADMITTING DIAGNOSES:  1. Thirty-seven weeks' gestation.  2. Mild preeclampsia.  3. Induction of labor.   DISCHARGE DIAGNOSES:  1. Thirty-seven weeks' gestation.  2. Mild preeclampsia.  3. Induction of labor.  4. Status post normal spontaneous vaginal delivery, viable female on Jan 31, 2007 at 2236, Apgars of 3 at one minute and 4 at five minutes,      7 at eight minutes, weight of 2922 grams, length of 51 cm.  Mother      and infant discharged home in good condition   REASON FOR ADMISSION:  A 30 year old, G2, P64, estimated date of  confinement of February 20, 2007 who presents for induction of labor for  pregnancy-induced hypertension.  The patient had been hospitalized over  the past month with elevated blood pressures and was stabilized during  that hospitalization and discharged home on bedrest and antihypertensive  therapy   PAST MEDICAL HISTORY:  Surgery:  None. Illness:  Chronic hypertension.   MEDICATIONS:  Prenatal vitamins, labetalol.   ALLERGIES:  No known drug allergies.   SOCIAL HISTORY:  Homemaker, single.  Negative history of tobacco,  alcohol or recreational drug use.   FAMILY HISTORY:  No major illnesses.   PAST OBSTETRICAL HISTORY:  Normal spontaneous vaginal delivery of a female  September 2005, full-term, 6 pounds 7 ounces.   PHYSICAL EXAMINATION:  Well-nourished, well-developed female in no acute  distress.  She is afebrile, blood pressure 139/86.  LUNGS:  Were clear to auscultation bilaterally.  HEART:  Regular rate and rhythm.  ABDOMEN:  Gravid, nontender.  CERVIX:  2-cm dilated, 80% effaced,  vertex at a minus 2 station.   ADMITTING LABORATORY VALUES:  Hemoglobin 11, hematocrit 35, white blood  cell count 9000, platelets 277,000.  Comprehensive metabolic panel was  within normal limits.   HOSPITAL COURSE:  The patient was admitted.  Two-stage induction.  Because of the favorable bishop's score, a decision was made to proceed  with low-dose Pitocin and active rupture of membranes.  The patient  progressed to normal spontaneous vaginal delivery, viable female infant,  Jan 31, 2007.  There were no complications.  Postpartum course was  uncomplicated.  The patient was discharged home on postpartum day #2 in  good condition.   DISCHARGE LABORATORY VALUES:  Hemoglobin 10, hematocrit 32, white blood  cell count 11,000, platelets 273,000.   DISCHARGE DISPOSITION:  Medication:  Continue prenatal vitamins,  continue labetalol as prescribed.  Routine written instructions were  given for discharge after vaginal delivery.  The patient is to call  office for a follow-up appointment for blood pressure check in 1 week.      Charles A. Clearance Coots, M.D.  Electronically Signed     CAH/MEDQ  D:  03/06/2007  T:  03/06/2007  Job:  [id number]

## 2011-01-29 ENCOUNTER — Inpatient Hospital Stay (INDEPENDENT_AMBULATORY_CARE_PROVIDER_SITE_OTHER)
Admission: RE | Admit: 2011-01-29 | Discharge: 2011-01-29 | Disposition: A | Discharge status: Home / Self Care | Payer: Self-pay | Source: Ambulatory Visit | Attending: Family Medicine | Admitting: Family Medicine

## 2011-01-29 DIAGNOSIS — R0789 Other chest pain: Secondary | ICD-10-CM

## 2011-01-29 DIAGNOSIS — J019 Acute sinusitis, unspecified: Secondary | ICD-10-CM

## 2011-06-02 LAB — POCT PREGNANCY, URINE: Operator id: 234501

## 2011-06-02 LAB — I-STAT 8, (EC8 V) (CONVERTED LAB)
Acid-Base Excess: 1
Bicarbonate: 26.8 — ABNORMAL HIGH
Chloride: 106
HCT: 41
Operator id: 234501
TCO2: 28
pCO2, Ven: 47.9

## 2011-06-02 LAB — URINALYSIS, ROUTINE W REFLEX MICROSCOPIC
Bilirubin Urine: NEGATIVE
Hgb urine dipstick: NEGATIVE
Ketones, ur: NEGATIVE
Protein, ur: NEGATIVE
Urobilinogen, UA: 0.2

## 2011-06-02 LAB — DIFFERENTIAL
Basophils Absolute: 0
Eosinophils Relative: 3
Lymphocytes Relative: 37
Monocytes Absolute: 0.7

## 2011-06-02 LAB — RPR: RPR Ser Ql: NONREACTIVE

## 2011-06-02 LAB — CBC
HCT: 37.8
Hemoglobin: 12.2
MCHC: 32.4
RDW: 14.1

## 2011-06-02 LAB — WET PREP, GENITAL: WBC, Wet Prep HPF POC: NONE SEEN

## 2011-06-12 LAB — URINALYSIS, ROUTINE W REFLEX MICROSCOPIC
Bilirubin Urine: NEGATIVE
Glucose, UA: NEGATIVE
Ketones, ur: NEGATIVE
Leukocytes, UA: NEGATIVE
Nitrite: NEGATIVE
Protein, ur: NEGATIVE
Specific Gravity, Urine: 1.025
Urobilinogen, UA: 0.2
pH: 6.5

## 2011-06-12 LAB — URINE MICROSCOPIC-ADD ON

## 2011-06-13 LAB — CBC
HCT: 36.9
MCV: 75.4 — ABNORMAL LOW
Platelets: 362
RDW: 13.8

## 2011-06-13 LAB — GC/CHLAMYDIA PROBE AMP, GENITAL: Chlamydia, DNA Probe: NEGATIVE

## 2011-06-13 LAB — ABO/RH: ABO/RH(D): A POS

## 2011-08-10 ENCOUNTER — Encounter: Payer: Self-pay | Admitting: Emergency Medicine

## 2011-08-10 ENCOUNTER — Emergency Department (HOSPITAL_COMMUNITY)
Admission: EM | Admit: 2011-08-10 | Discharge: 2011-08-11 | Disposition: A | Discharge status: Home / Self Care | Payer: Self-pay | Attending: Emergency Medicine | Admitting: Emergency Medicine

## 2011-08-10 DIAGNOSIS — R109 Unspecified abdominal pain: Secondary | ICD-10-CM | POA: Insufficient documentation

## 2011-08-10 DIAGNOSIS — R51 Headache: Secondary | ICD-10-CM | POA: Insufficient documentation

## 2011-08-10 DIAGNOSIS — H538 Other visual disturbances: Secondary | ICD-10-CM | POA: Insufficient documentation

## 2011-08-10 DIAGNOSIS — R350 Frequency of micturition: Secondary | ICD-10-CM | POA: Insufficient documentation

## 2011-08-10 HISTORY — DX: Essential (primary) hypertension: I10

## 2011-08-10 LAB — CBC
HCT: 35.1 % — ABNORMAL LOW (ref 36.0–46.0)
MCV: 75 fL — ABNORMAL LOW (ref 78.0–100.0)
RBC: 4.68 MIL/uL (ref 3.87–5.11)
WBC: 7.4 10*3/uL (ref 4.0–10.5)

## 2011-08-10 LAB — DIFFERENTIAL
Eosinophils Relative: 2 % (ref 0–5)
Lymphocytes Relative: 50 % — ABNORMAL HIGH (ref 12–46)
Lymphs Abs: 3.7 10*3/uL (ref 0.7–4.0)
Monocytes Absolute: 0.5 10*3/uL (ref 0.1–1.0)

## 2011-08-10 LAB — BASIC METABOLIC PANEL
CO2: 25 mEq/L (ref 19–32)
Chloride: 105 mEq/L (ref 96–112)
Sodium: 140 mEq/L (ref 135–145)

## 2011-08-10 NOTE — ED Notes (Signed)
PT. REPORTS INTERMITTENT LOW ABDOMINAL PAIN FOR 1 MONTH WITH HEADACHE , BLURRED VISION ,  SLIGHT NAUSEA , NO VOMITTING , NO DIARRHEA , DENIES FEVER , SLIGHT CHILLS.

## 2011-08-11 ENCOUNTER — Encounter (HOSPITAL_COMMUNITY): Payer: Self-pay | Admitting: Radiology

## 2011-08-11 ENCOUNTER — Emergency Department (HOSPITAL_COMMUNITY): Payer: Self-pay

## 2011-08-11 LAB — URINALYSIS, ROUTINE W REFLEX MICROSCOPIC
Bilirubin Urine: NEGATIVE
Glucose, UA: NEGATIVE mg/dL
Ketones, ur: NEGATIVE mg/dL
Leukocytes, UA: NEGATIVE
pH: 6 (ref 5.0–8.0)

## 2011-08-11 LAB — URINE MICROSCOPIC-ADD ON

## 2011-08-11 MED ORDER — OXYCODONE-ACETAMINOPHEN 5-325 MG PO TABS
2.0000 | ORAL_TABLET | Freq: Once | ORAL | Status: AC
  Administered 2011-08-11: 2 via ORAL
  Filled 2011-08-11: qty 2

## 2011-08-11 MED ORDER — HYDROCODONE-ACETAMINOPHEN 5-325 MG PO TABS: 1.0000 | ORAL_TABLET | ORAL | Status: AC | PRN

## 2011-08-11 NOTE — ED Provider Notes (Signed)
History     CSN: [id number] Arrival date & time: 08/10/2011  8:58 PM   First MD Initiated Contact with Patient 08/11/11 0011      Chief Complaint  Patient presents with  . Abdominal Pain    (Consider location/radiation/quality/duration/timing/severity/associated sxs/prior treatment) The history is provided by the patient.   patient reports approximately one month of constant lower abdominal pain.  She reports it feels like cramping.  She denies dysuria.  She does report urinary frequency.  She denies diarrhea.  No melena or hematochezia.  No nausea or vomiting.  No upper abdominal pain.  She's never been evaluated for this lower abdominal pain.  Denies vaginal discharge.  She reports intermittent vaginal bleeding with none at this time.  She has 3 kids.  She's never had an ectopic pregnancy.  She has not checked a urine pregnancy test at home.  She also reports mild headache for approximately one 2 months with some blurred vision.  She reports her vision up close as normal however her distance vision is poor.  She has not seen an optometrist for this.  She denies upper or lower sugar weakness.  She denies head trauma.  She's had no recent weight change.  Nothing worsens her symptoms.  Nothing improves her symptoms.  Her pain is mild to moderate at this time.  Past Medical History  Diagnosis Date  . Hypertension     History reviewed. No pertinent past surgical history.  No family history on file.  History  Substance Use Topics  . Smoking status: Never Smoker   . Smokeless tobacco: Not on file  . Alcohol Use: No    OB History    Grav Para Term Preterm Abortions TAB SAB Ect Mult Living                  Review of Systems  Gastrointestinal: Positive for abdominal pain.  All other systems reviewed and are negative.    Allergies  Review of patient's allergies indicates no known allergies.  Home Medications  No current outpatient prescriptions on file.  BP 138/82  Pulse  68  Temp(Src) 98.2 F (36.8 C) (Oral)  Resp 18  SpO2 100%  Physical Exam  Nursing note and vitals reviewed. Constitutional: She is oriented to person, place, and time. She appears well-developed and well-nourished.  HENT:  Head: Normocephalic and atraumatic.  Eyes: EOM are normal. Pupils are equal, round, and reactive to light. Right eye exhibits no discharge. Left eye exhibits no discharge.  Neck: Normal range of motion. Neck supple.  Cardiovascular: Normal rate and regular rhythm.   Pulmonary/Chest: Effort normal.  Abdominal: Soft. She exhibits no distension. There is no tenderness. There is no rebound and no guarding.  Musculoskeletal: Normal range of motion.  Neurological: She is alert and oriented to person, place, and time.       5/5 strength in major muscle groups of  bilateral upper and lower extremities. Speech normal. No facial asymetry.   Skin: Skin is warm and dry.  Psychiatric: She has a normal mood and affect. Judgment normal.    ED Course  Procedures (including critical care time)  Labs Reviewed  CBC - Abnormal; Notable for the following:    Hemoglobin 11.5 (*)    HCT 35.1 (*)    MCV 75.0 (*)    MCH 24.6 (*)    All other components within normal limits  DIFFERENTIAL - Abnormal; Notable for the following:    Neutrophils Relative 41 (*)  Lymphocytes Relative 50 (*)    All other components within normal limits  URINALYSIS, ROUTINE W REFLEX MICROSCOPIC - Abnormal; Notable for the following:    Hgb urine dipstick TRACE (*)    All other components within normal limits  URINE MICROSCOPIC-ADD ON - Abnormal; Notable for the following:    Squamous Epithelial / LPF MANY (*)    All other components within normal limits  BASIC METABOLIC PANEL  PREGNANCY, URINE   Ct Head Wo Contrast  08/11/2011  *RADIOLOGY REPORT*  Clinical Data: Headache and blurred vision.  CT HEAD WITHOUT CONTRAST  Technique:  Contiguous axial images were obtained from the base of the skull  through the vertex without contrast.  Comparison: None.  Findings: There is no evidence of acute infarction, mass lesion, or intra- or extra-axial hemorrhage on CT.  The posterior fossa, including the cerebellum, brainstem and fourth ventricle, is within normal limits.  The third and lateral ventricles, and basal ganglia are unremarkable in appearance.  The cerebral hemispheres are symmetric in appearance, with normal gray- white differentiation.  No mass effect or midline shift is seen.  There is no evidence of fracture; visualized osseous structures are unremarkable in appearance.  The visualized portions of the orbits are within normal limits.  The paranasal sinuses and mastoid air cells are well-aerated.  No significant soft tissue abnormalities are seen.  IMPRESSION: Unremarkable noncontrast CT of the head.  Original Report Authenticated By: Tonia Ghent, M.D.     1. Abdominal pain   2. Headache    MDM  Denies vaginal discharge.  Urine without signs of infection.  CT scan of her head negative for mass or bleed.  CT scan was obtained because the patient was very concerned that she could have a "brain tumor".  My suspicion for significant lower palpable pathology is very low.  He'll defer pelvic exam CT imaging at this time.  Close followup with her primary care Dr.  Stann Mainland await urine pregnancy test at this time.  Pain treated  3:11 AM Patient feels much better at this time.  Abdominal pain is improved.  Her headache is improved.  She's been given a resource guide to assist her in finding a primary care physician.  She's been instructed to return to the ER for any new or worsening symptoms.  Her head CT is negative      Lyanne Co, MD 08/11/11 [phone]

## 2011-08-11 NOTE — ED Notes (Signed)
Pt states lower abdominal pain for the past month. Pt states that she tried tylenol for pain with no relief. Pt denies problems with urination or bowel problems. Pt abdomen soft, bowel sounds present. Pt alert and oriented able to follow commands and move extremities.

## 2011-08-15 ENCOUNTER — Emergency Department (HOSPITAL_COMMUNITY)
Admission: EM | Admit: 2011-08-15 | Discharge: 2011-08-16 | Disposition: A | Discharge status: Home / Self Care | Payer: Self-pay | Attending: Emergency Medicine | Admitting: Emergency Medicine

## 2011-08-15 ENCOUNTER — Encounter (HOSPITAL_COMMUNITY): Payer: Self-pay | Admitting: Emergency Medicine

## 2011-08-15 DIAGNOSIS — I10 Essential (primary) hypertension: Secondary | ICD-10-CM | POA: Insufficient documentation

## 2011-08-15 DIAGNOSIS — R109 Unspecified abdominal pain: Secondary | ICD-10-CM | POA: Insufficient documentation

## 2011-08-15 DIAGNOSIS — N83209 Unspecified ovarian cyst, unspecified side: Secondary | ICD-10-CM | POA: Insufficient documentation

## 2011-08-15 DIAGNOSIS — D259 Leiomyoma of uterus, unspecified: Secondary | ICD-10-CM | POA: Insufficient documentation

## 2011-08-15 DIAGNOSIS — N898 Other specified noninflammatory disorders of vagina: Secondary | ICD-10-CM | POA: Insufficient documentation

## 2011-08-15 DIAGNOSIS — R011 Cardiac murmur, unspecified: Secondary | ICD-10-CM | POA: Insufficient documentation

## 2011-08-15 DIAGNOSIS — Z79899 Other long term (current) drug therapy: Secondary | ICD-10-CM | POA: Insufficient documentation

## 2011-08-15 NOTE — ED Notes (Signed)
PT. REPORTS PERSISTENT LOW ABDOMINAL PAIN FOR 2 MONTHS RADIATING TO LOER BACK , DENIES NAUSEA/VOMITTING OR DIARRHEA , SEEN HERE LAST WEEK PRESCRIBED WITH MEDICATIONS WITH NO RELIEF.

## 2011-08-16 ENCOUNTER — Emergency Department (HOSPITAL_COMMUNITY): Payer: Self-pay

## 2011-08-16 LAB — WET PREP, GENITAL
WBC, Wet Prep HPF POC: NONE SEEN
Yeast Wet Prep HPF POC: NONE SEEN

## 2011-08-16 LAB — DIFFERENTIAL
Basophils Absolute: 0 10*3/uL (ref 0.0–0.1)
Basophils Relative: 0 % (ref 0–1)
Eosinophils Absolute: 0.2 10*3/uL (ref 0.0–0.7)
Lymphocytes Relative: 55 % — ABNORMAL HIGH (ref 12–46)
Neutrophils Relative %: 37 % — ABNORMAL LOW (ref 43–77)

## 2011-08-16 LAB — URINALYSIS, ROUTINE W REFLEX MICROSCOPIC
Glucose, UA: NEGATIVE mg/dL
Leukocytes, UA: NEGATIVE
pH: 6 (ref 5.0–8.0)

## 2011-08-16 LAB — COMPREHENSIVE METABOLIC PANEL
ALT: 15 U/L (ref 0–35)
AST: 14 U/L (ref 0–37)
Albumin: 3.8 g/dL (ref 3.5–5.2)
Alkaline Phosphatase: 47 U/L (ref 39–117)
Glucose, Bld: 92 mg/dL (ref 70–99)
Potassium: 3.3 mEq/L — ABNORMAL LOW (ref 3.5–5.1)
Sodium: 136 mEq/L (ref 135–145)
Total Protein: 7 g/dL (ref 6.0–8.3)

## 2011-08-16 LAB — CBC
Hemoglobin: 11.3 g/dL — ABNORMAL LOW (ref 12.0–15.0)
MCHC: 31.7 g/dL (ref 30.0–36.0)
Platelets: 318 10*3/uL (ref 150–400)

## 2011-08-16 LAB — GC/CHLAMYDIA PROBE AMP, GENITAL: GC Probe Amp, Genital: NEGATIVE

## 2011-08-16 MED ORDER — METRONIDAZOLE 500 MG PO TABS: 500.0000 mg | ORAL_TABLET | Freq: Two times a day (BID) | ORAL | Status: AC

## 2011-08-16 MED ORDER — NAPROXEN 500 MG PO TABS: 500.0000 mg | ORAL_TABLET | Freq: Two times a day (BID) | ORAL | Status: DC

## 2011-08-16 MED ORDER — MORPHINE SULFATE 4 MG/ML IJ SOLN
4.0000 mg | Freq: Once | INTRAMUSCULAR | Status: AC
  Administered 2011-08-16: 4 mg via INTRAVENOUS
  Filled 2011-08-16: qty 1

## 2011-08-16 NOTE — ED Provider Notes (Signed)
History     CSN: [id number] Arrival date & time: 08/15/2011  9:35 PM   None     Chief Complaint  Patient presents with  . Abdominal Pain    (Consider location/radiation/quality/duration/timing/severity/associated sxs/prior treatment) HPI Comments: Latasha Simmons 30 y.o. female   Chief Complaint: Abdominal Pain  Information is obtained from : patient and past medical records  Onset of the pain was gradual, Pain is located at diffuse abdomen, Timing intermittent, Course worsened, Severity mild-moderate, , Quality is aching, Alleviating/Agravating Their symptoms are aggravated by movement and bending over., Symptoms improve with recumbency., She notes abnormal spotting but denies vaginal discharge., Patient notes no change in bowel habits, constipation, diarrhea, dysphagia, jaundice, melena, nausea and vomiting. and Past history includes hypertension., associated symptoms abdominal pain  Associated Rectal Bleeding: None Associated Urinary Symptoms: none  Prior Surgery: no surgeries  Use of Steroids none Use of NSAIDs none Alcohol intake none  Was prescribed hydrocodone at the last visit 5 days ago and states does not help her pain. Denies trauma, assault but does admit to having uterine fibroids diagnosed at last delivery 5 years ago. She is on implant contraceptive.  PMD none   Patient is a 30 y.o. female presenting with abdominal pain.  Abdominal Pain The primary symptoms of the illness include abdominal pain.    Past Medical History  Diagnosis Date  . Hypertension     History reviewed. No pertinent past surgical history.  No family history on file.  History  Substance Use Topics  . Smoking status: Never Smoker   . Smokeless tobacco: Not on file  . Alcohol Use: No    OB History    Grav Para Term Preterm Abortions TAB SAB Ect Mult Living                  Review of Systems  Gastrointestinal: Positive for abdominal pain.  All other systems  reviewed and are negative.    Allergies  Review of patient's allergies indicates no known allergies.  Home Medications   Current Outpatient Rx  Name Route Sig Dispense Refill  . HYDROCODONE-ACETAMINOPHEN 5-325 MG PO TABS Oral Take 1 tablet by mouth every 4 (four) hours as needed for pain. 15 tablet 0  . METRONIDAZOLE 500 MG PO TABS Oral Take 1 tablet (500 mg total) by mouth 2 (two) times daily. 14 tablet 0  . NAPROXEN 500 MG PO TABS Oral Take 1 tablet (500 mg total) by mouth 2 (two) times daily with a meal. 30 tablet 0    BP 120/68  Pulse 71  Temp(Src) 98.6 F (37 C) (Oral)  Resp 16  SpO2 100%  LMP 08/10/2011  Physical Exam  Nursing note and vitals reviewed. Constitutional: She appears well-developed and well-nourished. No distress.  HENT:  Head: Normocephalic and atraumatic.  Mouth/Throat: Oropharynx is clear and moist. No oropharyngeal exudate.  Eyes: Conjunctivae and EOM are normal. Pupils are equal, round, and reactive to light. Right eye exhibits no discharge. Left eye exhibits no discharge. No scleral icterus.  Neck: Normal range of motion. Neck supple. No JVD present. No thyromegaly present.  Cardiovascular: Normal rate, regular rhythm and intact distal pulses.  Exam reveals no gallop and no friction rub.   Murmur ( Soft systolic) heard. Pulmonary/Chest: Effort normal and breath sounds normal. No respiratory distress. She has no wheezes. She has no rales.  Abdominal: Soft. Bowel sounds are normal. She exhibits no distension and no mass. There is tenderness ( Right lower quadrant, left lower quadrant,  suprapubic tenderness without guarding or tympanitic sounds to percussion). There is no rebound and no guarding.       No Murphy sign, non-peritoneal  Genitourinary:       No CVA tenderness  Chaperone present for pelvic exam Normal appearing external genitalia, mild to moderate dark red blood from cervical os, no cervical motion tenderness or left adnexal tenderness, mild  right adnexal tenderness but no masses palpated  Musculoskeletal: Normal range of motion. She exhibits no edema and no tenderness.  Lymphadenopathy:    She has no cervical adenopathy.  Neurological: She is alert. Coordination normal.  Skin: Skin is warm and dry. No rash noted. No erythema.  Psychiatric: She has a normal mood and affect. Her behavior is normal.    ED Course  Procedures (including critical care time)  Labs Reviewed  CBC - Abnormal; Notable for the following:    Hemoglobin 11.3 (*)    HCT 35.6 (*)    MCV 74.9 (*)    MCH 23.8 (*)    All other components within normal limits  DIFFERENTIAL - Abnormal; Notable for the following:    Neutrophils Relative 37 (*)    Lymphocytes Relative 55 (*)    Lymphs Abs 4.3 (*)    All other components within normal limits  COMPREHENSIVE METABOLIC PANEL - Abnormal; Notable for the following:    Potassium 3.3 (*)    Total Bilirubin 0.2 (*)    All other components within normal limits  URINALYSIS, ROUTINE W REFLEX MICROSCOPIC - Abnormal; Notable for the following:    Hgb urine dipstick SMALL (*)    All other components within normal limits  WET PREP, GENITAL - Abnormal; Notable for the following:    Clue Cells, Wet Prep MODERATE (*)    All other components within normal limits  URINE MICROSCOPIC-ADD ON - Abnormal; Notable for the following:    Squamous Epithelial / LPF FEW (*)    All other components within normal limits  LIPASE, BLOOD  GC/CHLAMYDIA PROBE AMP, GENITAL   US Transvaginal Non-ob  08/16/2011  *RADIOLOGY REPORT*  Clinical Data: Lower abdominal pain and bleeding.  History of fibroids.  TRANSABDOMINAL AND TRANSVAGINAL ULTRASOUND OF PELVIS Technique:  Both transabdominal and transvaginal ultrasound examinations of the pelvis were performed. Transabdominal technique was performed for global imaging of the pelvis including uterus, ovaries, adnexal regions, and pelvic cul-de-sac.  Comparison: Pelvic ultrasound performed  10/29/2009   It was necessary to proceed with endovaginal exam following the transabdominal exam to visualize the uterus and ovaries in greater detail.  Findings:  Uterus: There has been interval increase in the size of the uterus. Some of this likely reflects measurement technique and the positioning of the uterus on the current study, but the AP and transverse dimensions are also mildly increased, possibly reflecting underlying poorly characterized fibroids, or mild worsening of the patient's known adenomyosis.  The uterus measures 14.9 cm in length, 6.8 cm in AP dimension and 6.5 cm in transverse dimension.  Myometrial echogenicity remains heterogeneous, likely reflecting the patient's adenomyosis.  Two fibroids are identified, one of which measures 3.1 x 2.9 x 2.3 cm at the fundus, while the other measures 1.9 x 1.5 x 1.4 cm along the lower uterine segment at the posterior wall.  Endometrium: Normal in thickness and appearance; measures 0.5 cm in thickness.  Right ovary:  Normal appearance/no adnexal mass; measures 2.9 x 1.5 x 2.1 cm.  Left ovary: Normal appearance/no suspicious adnexal mass; measures 4.4 x 3.6 x 3.2 cm.  A 3.6 x 3.5 x 2.8 cm hypoechoic focus likely reflects a left ovarian cyst, most likely physiologic in nature.  Other findings: No free fluid seen within the pelvic cul-de-sac.  IMPRESSION:  1.  Interval increase in the size of the uterus may reflect mild worsening in the patient's known adenomyosis, with diffuse myometrial heterogeneity again seen. 2.  Two fibroids identified, measuring 3.1 cm and 1.9 cm; these have increased in size from the prior study. 3.  3.6 cm left adnexal cyst is most likely physiologic in nature. No evidence for ovarian torsion.  Original Report Authenticated By: Tonia Ghent, M.D.   US Pelvis Complete  08/16/2011  *RADIOLOGY REPORT*  Clinical Data: Lower abdominal pain and bleeding.  History of fibroids.  TRANSABDOMINAL AND TRANSVAGINAL ULTRASOUND OF PELVIS  Technique:  Both transabdominal and transvaginal ultrasound examinations of the pelvis were performed. Transabdominal technique was performed for global imaging of the pelvis including uterus, ovaries, adnexal regions, and pelvic cul-de-sac.  Comparison: Pelvic ultrasound performed 10/29/2009   It was necessary to proceed with endovaginal exam following the transabdominal exam to visualize the uterus and ovaries in greater detail.  Findings:  Uterus: There has been interval increase in the size of the uterus. Some of this likely reflects measurement technique and the positioning of the uterus on the current study, but the AP and transverse dimensions are also mildly increased, possibly reflecting underlying poorly characterized fibroids, or mild worsening of the patient's known adenomyosis.  The uterus measures 14.9 cm in length, 6.8 cm in AP dimension and 6.5 cm in transverse dimension.  Myometrial echogenicity remains heterogeneous, likely reflecting the patient's adenomyosis.  Two fibroids are identified, one of which measures 3.1 x 2.9 x 2.3 cm at the fundus, while the other measures 1.9 x 1.5 x 1.4 cm along the lower uterine segment at the posterior wall.  Endometrium: Normal in thickness and appearance; measures 0.5 cm in thickness.  Right ovary:  Normal appearance/no adnexal mass; measures 2.9 x 1.5 x 2.1 cm.  Left ovary: Normal appearance/no suspicious adnexal mass; measures 4.4 x 3.6 x 3.2 cm.  A 3.6 x 3.5 x 2.8 cm hypoechoic focus likely reflects a left ovarian cyst, most likely physiologic in nature.  Other findings: No free fluid seen within the pelvic cul-de-sac.  IMPRESSION:  1.  Interval increase in the size of the uterus may reflect mild worsening in the patient's known adenomyosis, with diffuse myometrial heterogeneity again seen. 2.  Two fibroids identified, measuring 3.1 cm and 1.9 cm; these have increased in size from the prior study. 3.  3.6 cm left adnexal cyst is most likely physiologic in  nature. No evidence for ovarian torsion.  Original Report Authenticated By: Tonia Ghent, M.D.     1. Fibroid uterus   2. Ovarian cyst       MDM  Well-appearing, normal vital signs, last ER visit reviewed showing no signs of urinary tract infection or significant lab abnormalities. Will repeat CBC, Liver function and lipase, pelvic exam.  Ultrasound ordered, further evaluate right adnexa, possible ovarian cyst, doubt torsion given 2 months of pain and minimal tenderness on exam.  Ultrasound reviewed, lab work reviewed, patient to be treated for bacterial vaginosis and referred to OB/GYN for uterine fibroids and ovarian cyst. Discussed with patient, reassurance given, followup encouraged  Vida Roller, MD 08/16/11 0401

## 2011-08-16 NOTE — ED Notes (Signed)
Pt has returned from ultrasound.  

## 2011-08-16 NOTE — ED Notes (Signed)
Patient is in Ultrasound at this time.

## 2011-09-27 ENCOUNTER — Other Ambulatory Visit (HOSPITAL_COMMUNITY): Payer: Self-pay | Admitting: Family Medicine

## 2011-09-27 DIAGNOSIS — R1900 Intra-abdominal and pelvic swelling, mass and lump, unspecified site: Secondary | ICD-10-CM

## 2011-10-03 ENCOUNTER — Ambulatory Visit (HOSPITAL_COMMUNITY)
Admission: RE | Admit: 2011-10-03 | Discharge: 2011-10-03 | Disposition: A | Discharge status: Home / Self Care | Payer: Self-pay | Source: Ambulatory Visit | Attending: Family Medicine | Admitting: Family Medicine

## 2011-10-03 ENCOUNTER — Other Ambulatory Visit (HOSPITAL_COMMUNITY): Payer: Self-pay

## 2011-10-03 DIAGNOSIS — N938 Other specified abnormal uterine and vaginal bleeding: Secondary | ICD-10-CM | POA: Insufficient documentation

## 2011-10-03 DIAGNOSIS — N949 Unspecified condition associated with female genital organs and menstrual cycle: Secondary | ICD-10-CM | POA: Insufficient documentation

## 2011-10-03 DIAGNOSIS — R9389 Abnormal findings on diagnostic imaging of other specified body structures: Secondary | ICD-10-CM | POA: Insufficient documentation

## 2011-10-03 DIAGNOSIS — R1900 Intra-abdominal and pelvic swelling, mass and lump, unspecified site: Secondary | ICD-10-CM

## 2011-11-02 ENCOUNTER — Encounter: Payer: Self-pay | Admitting: Obstetrics & Gynecology

## 2011-11-02 ENCOUNTER — Ambulatory Visit (INDEPENDENT_AMBULATORY_CARE_PROVIDER_SITE_OTHER): Payer: Self-pay | Admitting: Obstetrics & Gynecology

## 2011-11-02 ENCOUNTER — Other Ambulatory Visit (HOSPITAL_COMMUNITY)
Admission: RE | Admit: 2011-11-02 | Discharge: 2011-11-02 | Disposition: A | Discharge status: Home / Self Care | Payer: Self-pay | Source: Ambulatory Visit | Attending: Obstetrics & Gynecology | Admitting: Obstetrics & Gynecology

## 2011-11-02 VITALS — BP 138/82 | HR 101 | Temp 98.4°F | Ht 66.0 in | Wt 243.0 lb

## 2011-11-02 DIAGNOSIS — N920 Excessive and frequent menstruation with regular cycle: Secondary | ICD-10-CM

## 2011-11-02 DIAGNOSIS — Z23 Encounter for immunization: Secondary | ICD-10-CM

## 2011-11-02 MED ORDER — INFLUENZA VIRUS VACC SPLIT PF IM SUSP
0.5000 mL | INTRAMUSCULAR | Status: AC
  Administered 2011-11-02: 0.5 mL via INTRAMUSCULAR

## 2011-11-02 NOTE — Progress Notes (Signed)
  Subjective:    Patient ID: Latasha Simmons, female    DOB: August 27, 1981, 31 y.o.   MRN: [id number]  HPI  She is a M African G3P3 who is a referral from the health dept for evaluation of heavy irregular bleeding for about a year. She has had an Implanon for about 2 years. She has had 2 ultrasounds through the [facility] system in the last 2 months. She has an enlarged uterus 15 cm with fibroids and adenomyosis. She is here for an [facility] today. A hemoglobin was 11.4 recently. She would like to have more children.  Review of Systems She would like a flu shot today.    Objective:   Physical Exam   14 week size uterus, no adnexal masses or tenderness Cervix prepped with betadine, single tooth tenaculum on anterior lip of cervix. Pipelle passed twice with moderate amount of tissue obtained. She tolerated procedure well.     Assessment & Plan:  DUB- She has 3 obvious causes- Implanon, fibroids, and adenomyosis. I will check a TSH today (cervical cultures were negative). We will discuss treatment options at her next visit.

## 2011-11-03 LAB — TSH: TSH: 1.554 u[IU]/mL (ref 0.350–4.500)

## 2011-11-07 ENCOUNTER — Emergency Department (INDEPENDENT_AMBULATORY_CARE_PROVIDER_SITE_OTHER): Payer: Self-pay

## 2011-11-07 ENCOUNTER — Encounter (HOSPITAL_COMMUNITY): Payer: Self-pay | Admitting: *Deleted

## 2011-11-07 ENCOUNTER — Emergency Department (INDEPENDENT_AMBULATORY_CARE_PROVIDER_SITE_OTHER)
Admission: EM | Admit: 2011-11-07 | Discharge: 2011-11-07 | Disposition: A | Discharge status: Home / Self Care | Payer: Self-pay | Source: Home / Self Care | Attending: Emergency Medicine | Admitting: Emergency Medicine

## 2011-11-07 DIAGNOSIS — R071 Chest pain on breathing: Secondary | ICD-10-CM

## 2011-11-07 DIAGNOSIS — R0789 Other chest pain: Secondary | ICD-10-CM

## 2011-11-07 MED ORDER — HYDROCODONE-ACETAMINOPHEN 5-325 MG PO TABS: 2.0000 | ORAL_TABLET | ORAL | Status: AC | PRN

## 2011-11-07 MED ORDER — IBUPROFEN 800 MG PO TABS: 800.0000 mg | ORAL_TABLET | Freq: Three times a day (TID) | ORAL | Status: AC

## 2011-11-07 NOTE — ED Notes (Signed)
Pt  Reports  Somewhat  Vague  Symptoms  Of  Chest  Pain  When  She  Takes  A  Deep breath  -  She reports  The  Pain has  Been  Present  For  About  1  Month   She  Appears  In no  Distress  She  Is  Sitting  Upright on the  Exam  Table  And  Appears  In no  Distress      She  Is  Speaking in  Complete  sentances       She  Reports  A history of htn in past

## 2011-11-07 NOTE — ED Provider Notes (Signed)
History     CSN: [id number]  Arrival date & time 11/07/11  1906   First MD Initiated Contact with Patient 11/07/11 2104      Chief Complaint  Patient presents with  . Chest Pain    (Consider location/radiation/quality/duration/timing/severity/associated sxs/prior treatment) Patient is a 31 y.o. female presenting with chest pain. The history is provided by the patient. No language interpreter was used.  Chest Pain The chest pain began more  than 1 month ago. Chest pain occurs constantly. The chest pain is unchanged. The pain is associated with breathing and coughing. At its most intense, the pain is at 6/10. The pain is currently at 6/10. The quality of the pain is described as aching. The pain does not radiate. Chest pain is worsened by deep breathing. Primary symptoms include cough. Pertinent negatives for primary symptoms include no fever.  Pertinent negatives for associated symptoms include no claudication. She tried nothing for the symptoms. Risk factors include no known risk factors.  Her past medical history is significant for hypertension.    patient complains of pain in her chest for one month patient reports pain occurs when she takes a deep breath or she coughs. Pain is in bilateral sides of the chest in the lower rib area. Patient reports that she did have a cough and cold symptoms approximately a month ago. Patient has not had any recent travel no unusual swelling in extremities.  Past Medical History  Diagnosis Date  . Hypertension   . Anemia   . Heart murmur   . Morbid obesity     History reviewed. No pertinent past surgical history.  History reviewed. No pertinent family history.  History  Substance Use Topics  . Smoking status: Never Smoker   . Smokeless tobacco: Never Used  . Alcohol Use: No    OB History    Grav Para Term Preterm Abortions TAB SAB Ect Mult Living   3 3 3       3       Review of Systems  Constitutional: Negative for fever.    Respiratory: Positive for cough.   Cardiovascular: Positive for chest pain. Negative for claudication.  All other systems reviewed and are negative.    Allergies  Review of patient's allergies indicates no known allergies.  Home Medications   Current Outpatient Rx  Name Route Sig Dispense Refill  . LABETALOL HCL 200 MG PO TABS Oral Take 200 mg by mouth 2 (two) times daily.    Marland Kitchen FERROUS FUMARATE 325 (106 FE) MG PO TABS Oral Take 1 tablet by mouth.    . ADULT MULTIVITAMIN W/MINERALS CH Oral Take 1 tablet by mouth daily.    Marland Kitchen NAPROXEN 500 MG PO TABS Oral Take 1 tablet (500 mg total) by mouth 2 (two) times daily with a meal. 30 tablet 0    BP 142/90  Pulse 81  Temp(Src) 98.4 F (36.9 C) (Oral)  Resp 16  SpO2 98%  Physical Exam  Vitals reviewed. Constitutional: She is oriented to person, place, and time. She appears well-developed and well-nourished.  HENT:  Head: Normocephalic and atraumatic.  Right Ear: External ear normal.  Left Ear: External ear normal.  Nose: Nose normal.  Mouth/Throat: Oropharynx is clear and moist.  Eyes: Conjunctivae are normal. Pupils are equal, round, and reactive to light.  Neck: Normal range of motion. Neck supple.  Cardiovascular: Normal rate and normal heart sounds.   Pulmonary/Chest: Effort normal and breath sounds normal.  Abdominal: Soft. Bowel sounds are normal.  Neurological: She is alert and oriented to person, place, and time.  Skin: Skin is warm.  Psychiatric: She has a normal mood and affect.    ED Course  Procedures (including critical care time)  Labs Reviewed - No data to display No results found.   No diagnosis found.    MDM  Chest xray normal        Langston Masker, [place] 11/07/11 2211

## 2011-11-07 NOTE — Discharge Instructions (Signed)
Chest Wall Pain Chest wall pain is pain in or around the bones and muscles of your chest. This may occur:   On its own (spontaneously).   After a viral illness such as the flu.   Through injur.   From coughing.   Minor exercise.  It may take up to 6 weeks to get better; longer if you must stay physically active in your work and activities. HOME CARE INSTRUCTIONS   Avoid over-tiring physical activity. Try not to strain or perform activities which cause pain. This would include any activities using chest, belly (abdominal) and side muscles, especially if heavy weights are used.   Use ice on the painful area for 15 to 20 minutes per hour while awake for the first 2 days. Place the ice in a plastic bag and place a towel between the bag of ice and your skin.   Only take over-the-counter or prescription medicines for pain, discomfort, or fever as directed by your caregiver.  SEEK IMMEDIATE MEDICAL CARE IF:   Your pain increases or you are very uncomfortable.   An oral temperature above 102 F (38.9 C)develops.   Your chest pains become worse.   You develop new, unexplained problems (symptoms).   You develop nausea, vomiting, sweating or feel light headed.   You develop a cough which produces phlegm (sputum) or you cough up blood.  MAKE SURE YOU:   Understand these instructions.   Will watch your condition.   Will get help right away if you are not doing well or get worse.  Document Released: 08/28/2005 Document Revised: 03/13/2011 Document Reviewed: 04/15/2008 [employer] Patient Information 2012 [employer]. 

## 2011-11-07 NOTE — ED Provider Notes (Signed)
Medical screening examination/treatment/procedure(s) were performed by non-physician practitioner and as supervising physician I was immediately available for consultation/collaboration.  Corrie Mckusick, MD 11/07/11 2252

## 2011-11-24 ENCOUNTER — Encounter: Payer: Self-pay | Admitting: Obstetrics and Gynecology

## 2011-11-24 ENCOUNTER — Ambulatory Visit (INDEPENDENT_AMBULATORY_CARE_PROVIDER_SITE_OTHER): Payer: Self-pay | Admitting: Obstetrics and Gynecology

## 2011-11-24 DIAGNOSIS — D259 Leiomyoma of uterus, unspecified: Secondary | ICD-10-CM

## 2011-11-24 NOTE — Progress Notes (Signed)
  Subjective:    Patient ID: Latasha Simmons, female    DOB: [birth year], 31 y.o.   MRN: [id number]  HPI  31 yo G3P3 with LMP 11/24/2011 and BMI 38 presenting today as a referral from the health department for evaluation of fibroid uterus and ovarian cyst. Patient reports daily bleeding over the past year which she describes as heavy at times and mostly daily spotting. Patient was using Implanon for birth control since 2010 and it was recently removed on 11/15/2011. Patient was told that a hysterectomy may be necessary and now desires one more child before surgical intervention. Results of the January ultrasound and endometrial biopsy were explained. Patient also reports requiring clomid to conceive with her last 2 pregnancies.  Review of Systems  All other systems reviewed and are negative.       Objective:   Physical Exam GENERAL: Well-developed, well-nourished female in no acute distress.  ABDOMEN: Soft, nontender, nondistended. No organomegaly. PELVIC: Normal external female genitalia. Vagina is pink and rugated.  Normal discharge. Normal appearing cervix. Bimanual exam limited secondary to body habitus. No adnexal mass or tenderness. EXTREMITIES: No cyanosis, clubbing, or edema, 2+ distal pulses.     Assessment & Plan:  31 yo G3P3 with abnormal uterine bleeding and small fibroid uterus (with fibroids being subserosal largest 3 cm) - Expectant vs medical management with birth control discussed with the patient. Patient desires to try to conceive immediately.  - Patient asked to keep a menstrual calendar over the past 3 months, as this abnormal bleeding may be associated with the Implanon. Most recent ultrasound demonstrates an 11 cm uterus - Patient will return in 3-4 months for follow-up

## 2012-01-04 ENCOUNTER — Encounter (HOSPITAL_COMMUNITY): Payer: Self-pay | Admitting: Emergency Medicine

## 2012-01-04 ENCOUNTER — Emergency Department (INDEPENDENT_AMBULATORY_CARE_PROVIDER_SITE_OTHER)
Admission: EM | Admit: 2012-01-04 | Discharge: 2012-01-04 | Disposition: A | Discharge status: Home / Self Care | Payer: Self-pay | Source: Home / Self Care | Attending: Emergency Medicine | Admitting: Emergency Medicine

## 2012-01-04 DIAGNOSIS — J329 Chronic sinusitis, unspecified: Secondary | ICD-10-CM

## 2012-01-04 DIAGNOSIS — J309 Allergic rhinitis, unspecified: Secondary | ICD-10-CM

## 2012-01-04 DIAGNOSIS — J302 Other seasonal allergic rhinitis: Secondary | ICD-10-CM

## 2012-01-04 LAB — POCT URINALYSIS DIP (DEVICE)
Bilirubin Urine: NEGATIVE
Glucose, UA: NEGATIVE mg/dL
Ketones, ur: NEGATIVE mg/dL
Leukocytes, UA: NEGATIVE
Nitrite: NEGATIVE

## 2012-01-04 LAB — POCT PREGNANCY, URINE: Preg Test, Ur: NEGATIVE

## 2012-01-04 LAB — POCT RAPID STREP A: Streptococcus, Group A Screen (Direct): NEGATIVE

## 2012-01-04 MED ORDER — DOXYCYCLINE HYCLATE 100 MG PO CAPS: 100.0000 mg | ORAL_CAPSULE | Freq: Two times a day (BID) | ORAL | Status: AC

## 2012-01-04 MED ORDER — FEXOFENADINE-PSEUDOEPHED ER 60-120 MG PO TB12: 1.0000 | ORAL_TABLET | Freq: Two times a day (BID) | ORAL | Status: DC

## 2012-01-04 MED ORDER — KETOTIFEN FUMARATE 0.025 % OP SOLN: 1.0000 [drp] | Freq: Two times a day (BID) | OPHTHALMIC | Status: AC

## 2012-01-04 MED ORDER — IBUPROFEN 400 MG PO TABS: 600.0000 mg | ORAL_TABLET | Freq: Four times a day (QID) | ORAL | Status: DC | PRN

## 2012-01-04 MED ORDER — FLUTICASONE PROPIONATE 50 MCG/ACT NA SUSP: 2.0000 | Freq: Every day | NASAL | Status: DC

## 2012-01-04 NOTE — ED Notes (Signed)
PT HERE WITH SINUS/URI THAT STARTED X 1 MNTH AGO UNRELIEVED BY OTC ZYRTEC,CLARITIN AND IBUPROFEN.SX SINUS PRESSURE HEAD AND EYES,CONGESTION,SORE THROAT AND EAR PAIN.AFEBRILE.DENIES SOB OR FEVERS

## 2012-01-04 NOTE — ED Provider Notes (Signed)
History     CSN: [id number]  Arrival date & time 01/04/12  4098   First MD Initiated Contact with Patient 01/04/12 1014      Chief Complaint  Patient presents with  . Sinusitis  . URI    (Consider location/radiation/quality/duration/timing/severity/associated sxs/prior treatment) HPI Comments: Patient reports sore throat, raspy voice starting 2 days ago. Reports bilateral ear pain, but no change in hearing - states "things sound faraway" Pain with swallowing. States she feels feverish at home, but has no documented fevers. No drooling, trismus. Some coughing, no wheezing, chest pain, shortness of breath. Patient reports itchy, watery eyes, sneezing, clear rhinorrhea, nasal congestion worse when she goes outside on high pollen days, better when being inside the past month. Reports some sinus pressure, worse with bending forward and with lying down. No purulent nasal drainage, dental pain.. Patient has no formal diagnosis seasonal allergies. She's been taking Claritin, Zyrtec without relief.   ROS as noted in HPI. All other ROS negative.   Patient is a 31 y.o. female presenting with URI and pharyngitis. The history is provided by the patient. No language interpreter was used.  URI  Sore Throat    Past Medical History  Diagnosis Date  . Hypertension   . Anemia   . Heart murmur   . Morbid obesity     History reviewed. No pertinent past surgical history.  History reviewed. No pertinent family history.  History  Substance Use Topics  . Smoking status: Never Smoker   . Smokeless tobacco: Never Used  . Alcohol Use: No    OB History    Grav Para Term Preterm Abortions TAB SAB Ect Mult Living   3 3 3       3       Review of Systems  Allergies  Review of patient's allergies indicates no known allergies.  Home Medications   Current Outpatient Rx  Name Route Sig Dispense Refill  . LISINOPRIL 20 MG PO TABS Oral Take 20 mg by mouth daily.    Marland Kitchen DOXYCYCLINE HYCLATE 100  MG PO CAPS Oral Take 1 capsule (100 mg total) by mouth 2 (two) times daily. X 7 days 14 capsule 0  . FERROUS FUMARATE 325 (106 FE) MG PO TABS Oral Take 1 tablet by mouth.    Marland Kitchen FEXOFENADINE-PSEUDOEPHED ER 60-120 MG PO TB12 Oral Take 1 tablet by mouth every 12 (twelve) hours. 30 tablet 0  . FLUTICASONE PROPIONATE 50 MCG/ACT NA SUSP Nasal Place 2 sprays into the nose daily. 16 g 0  . IBUPROFEN 400 MG PO TABS Oral Take 1.5 tablets (600 mg total) by mouth every 6 (six) hours as needed for pain. 20 tablet 0  . KETOTIFEN FUMARATE 0.025 % OP SOLN Both Eyes Place 1 drop into both eyes 2 (two) times daily. 5 mL 0  . ADULT MULTIVITAMIN W/MINERALS CH Oral Take 1 tablet by mouth daily.      BP 126/86  Pulse 79  Temp(Src) 98.5 F (36.9 C) (Oral)  Resp 16  SpO2 97%  LMP 12/21/2011  Physical Exam  Nursing note and vitals reviewed. Constitutional: She is oriented to person, place, and time. She appears well-developed and well-nourished.  HENT:  Head: Normocephalic and atraumatic.  Right Ear: Ear canal normal. Tympanic membrane is retracted. No decreased hearing is noted.  Left Ear: Ear canal normal. Tympanic membrane is retracted. No decreased hearing is noted.  Nose: Mucosal edema and rhinorrhea present.  Mouth/Throat: Uvula is midline and mucous membranes are normal.  Purulent nasal drainage. (-) frontal, maxillary sinus tenderness. Bilateral enlarged, erythematous tonsils. No exudates.  Eyes: Conjunctivae and EOM are normal. Pupils are equal, round, and reactive to light.  Neck: Normal range of motion. Neck supple.  Cardiovascular: Normal rate, regular rhythm and normal heart sounds.   Pulmonary/Chest: Effort normal and breath sounds normal. She has no rales.  Abdominal: Soft. She exhibits no distension.  Musculoskeletal: Normal range of motion.  Lymphadenopathy:       Shotty cervical LN  Neurological: She is alert and oriented to person, place, and time.  Skin: Skin is warm and dry. No  rash noted.  Psychiatric: She has a normal mood and affect. Her behavior is normal. Judgment and thought content normal.    ED Course  Procedures (including critical care time)  Labs Reviewed  POCT URINALYSIS DIP (DEVICE) - Abnormal; Notable for the following:    Hgb urine dipstick SMALL (*)    All other components within normal limits  POCT RAPID STREP A ([facility] ONLY)  POCT PREGNANCY, URINE   No results found.   1. Sinusitis   2. Seasonal allergies     Results for orders placed during the hospital encounter of 01/04/12  POCT RAPID STREP A ([facility] ONLY)      Component Value Range   Streptococcus, Group A Screen (Direct) NEGATIVE  NEGATIVE   POCT PREGNANCY, URINE      Component Value Range   Preg Test, Ur NEGATIVE  NEGATIVE   POCT URINALYSIS DIP (DEVICE)      Component Value Range   Glucose, UA NEGATIVE  NEGATIVE (mg/dL)   Bilirubin Urine NEGATIVE  NEGATIVE    Ketones, ur NEGATIVE  NEGATIVE (mg/dL)   Specific Gravity, Urine 1.025  1.005 - 1.030    Hgb urine dipstick SMALL (*) NEGATIVE    pH 6.0  5.0 - 8.0    Protein, ur NEGATIVE  NEGATIVE (mg/dL)   Urobilinogen, UA 0.2  0.0 - 1.0 (mg/dL)   Nitrite NEGATIVE  NEGATIVE    Leukocytes, UA NEGATIVE  NEGATIVE      MDM  Patient appears to have seasonal allergies which is now turned into a sinus infection. Since this has a significant allergy component will send home with Allegra D. patient has significant nasal congestion. & pt will benefit from decongestion. Will have patient monitor her blood pressure of taking this, discontinued if blood pressure becomes high. Also sending home with Flonase, saline nasal irrigation, doxycycline, allergy eeydrops. Feel that her sore throat is most likely from postnasal drip.   Luiz Blare, MD 01/04/12 1151

## 2012-01-04 NOTE — Discharge Instructions (Signed)
Take the medication as written. Return if you get worse, have a fever >100.4, or for any concerns. You may take 600 mg of motrin with 1 gram of tylenol up to 4 times a day as needed for pain. This is an effective combination for pain. Use a neti pot or the NeilMed sinus rinse as often as you want to to reduce nasal congestion. Follow the directions on the box. Monitor your blood pressure while you're taking the Allegra-D. This pseudoephedrine component may raise your blood pressure. Stop the Allegra-D if your blood pressure and starts getting high.  Go to www.goodrx.com to look up your medications. This will give you a list of where you can find your prescriptions at the most affordable prices.

## 2012-02-28 ENCOUNTER — Inpatient Hospital Stay (HOSPITAL_COMMUNITY)
Admission: AD | Admit: 2012-02-28 | Discharge: 2012-02-28 | Disposition: A | Discharge status: Home / Self Care | Payer: Self-pay | Source: Ambulatory Visit | Attending: Obstetrics & Gynecology | Admitting: Obstetrics & Gynecology

## 2012-02-28 ENCOUNTER — Encounter (HOSPITAL_COMMUNITY): Payer: Self-pay

## 2012-02-28 DIAGNOSIS — I10 Essential (primary) hypertension: Secondary | ICD-10-CM | POA: Insufficient documentation

## 2012-02-28 DIAGNOSIS — R51 Headache: Secondary | ICD-10-CM | POA: Insufficient documentation

## 2012-02-28 DIAGNOSIS — M545 Low back pain, unspecified: Secondary | ICD-10-CM | POA: Insufficient documentation

## 2012-02-28 DIAGNOSIS — M79609 Pain in unspecified limb: Secondary | ICD-10-CM | POA: Insufficient documentation

## 2012-02-28 DIAGNOSIS — M79605 Pain in left leg: Secondary | ICD-10-CM

## 2012-02-28 LAB — URINALYSIS, ROUTINE W REFLEX MICROSCOPIC
Bilirubin Urine: NEGATIVE
Nitrite: NEGATIVE
Protein, ur: NEGATIVE mg/dL
Urobilinogen, UA: 0.2 mg/dL (ref 0.0–1.0)

## 2012-02-28 MED ORDER — CYCLOBENZAPRINE HCL 10 MG PO TABS: 10.0000 mg | ORAL_TABLET | Freq: Three times a day (TID) | ORAL | Status: DC | PRN

## 2012-02-28 MED ORDER — KETOROLAC TROMETHAMINE 60 MG/2ML IM SOLN
60.0000 mg | Freq: Once | INTRAMUSCULAR | Status: AC
  Administered 2012-02-28: 60 mg via INTRAMUSCULAR
  Filled 2012-02-28: qty 2

## 2012-02-28 NOTE — MAU Note (Signed)
Patient is in with a week ongoing of waist, back pain and left side shooting pain. She states that she started having left leg cramping pain last night. She states that she called her primary doctor and they did not have appts available. She denies any vaginal bleeding or lof.

## 2012-02-28 NOTE — MAU Provider Note (Signed)
History     CSN: [id number]  Arrival date and time: 02/28/12 1423   First Provider Initiated Contact with Patient 02/28/12 1519      Chief Complaint  Patient presents with  . Back Pain  . Leg Pain  . Excessive Sweating  . Headache   HPI Latasha Simmons 31 y.o. presenting today for LL back back that is radiating down to L knee.  Onset today when she woke up.  Reports 7/10 achy pain, denies any known injury.  Denies numbness or tingling.  Denies any OB-GYN complaint.  [facility] and is being treated for an infection.  (Is taking Flagyl to treat).  Reports a hx of HTN, states takes Lisinopril as directed.  Nexplanon was removed in March.  Patient is now trying to get pregnant.     Pertinent Gynecological History: Menses: LMP on 12-21-11.   Past Medical History  Diagnosis Date  . Hypertension   . Anemia   . Heart murmur   . Morbid obesity     History reviewed. No pertinent past surgical history.  History reviewed. No pertinent family history.  History  Substance Use Topics  . Smoking status: Never Smoker   . Smokeless tobacco: Never Used  . Alcohol Use: No    Allergies: No Known Allergies  Prescriptions prior to admission  Medication Sig Dispense Refill  . ferrous fumarate (HEMOCYTE - 106 MG FE) 325 (106 FE) MG TABS Take 1 tablet by mouth.      . fexofenadine-pseudoephedrine (ALLEGRA-D) 60-120 MG per tablet Take 1 tablet by mouth every 12 (twelve) hours.  30 tablet  0  . fluticasone (FLONASE) 50 MCG/ACT nasal spray Place 2 sprays into the nose daily.  16 g  0  . ibuprofen (ADVIL,MOTRIN) 400 MG tablet Take 1.5 tablets (600 mg total) by mouth every 6 (six) hours as needed for pain.  20 tablet  0  . lisinopril (PRINIVIL,ZESTRIL) 20 MG tablet Take 20 mg by mouth daily.      . Multiple Vitamin (MULITIVITAMIN WITH MINERALS) TABS Take 1 tablet by mouth daily.        Review of Systems  Constitutional: Negative.     HENT: Negative.   Eyes: Negative.   Respiratory: Negative.   Cardiovascular: Negative.   Gastrointestinal: Negative.   Genitourinary: Negative.   Musculoskeletal: Positive for back pain.       See HPI.  Skin: Negative.   Neurological: Negative for tingling and tremors.  Endo/Heme/Allergies: Negative.   Psychiatric/Behavioral: Negative.    Physical Exam   Blood pressure 132/82, pulse 85, temperature 99 F (37.2 C), resp. rate 18, height 5\' 6"  (1.676 m), weight 113.853 kg (251 lb), last menstrual period 12/21/2011.  Physical Exam  Constitutional: She is oriented to person, place, and time. She appears well-developed and well-nourished.  HENT:  Head: Normocephalic and atraumatic.  Neck: Normal range of motion. Neck supple.  Cardiovascular: Normal rate, regular rhythm, normal heart sounds and intact distal pulses.  Exam reveals no gallop and no friction rub.   No murmur heard. Respiratory: Effort normal and breath sounds normal.  Musculoskeletal: Normal range of motion. She exhibits no edema and no tenderness.       Musculoskeletal Intact.  CMS intact.  5/5 strength bilaterally.  Point tenderness perispinal L lower back.    Neurological: She is alert and oriented to person, place, and time. She exhibits normal muscle tone. Coordination normal.       Neuro  intact.  Skin: Skin is warm and dry.  Psychiatric: She has a normal mood and affect. Her behavior is normal.    MAU Course  Procedures Results for orders placed during the hospital encounter of 02/28/12 (from the past 24 hour(s))  URINALYSIS, ROUTINE W REFLEX MICROSCOPIC     Status: Abnormal   Collection Time   02/28/12  2:40 PM      Component Value Range   Color, Urine YELLOW  YELLOW   APPearance CLEAR  CLEAR   Specific Gravity, Urine 1.025  1.005 - 1.030   pH 6.0  5.0 - 8.0   Glucose, UA NEGATIVE  NEGATIVE mg/dL   Hgb urine dipstick SMALL (*) NEGATIVE   Bilirubin Urine NEGATIVE  NEGATIVE   Ketones, ur NEGATIVE   NEGATIVE mg/dL   Protein, ur NEGATIVE  NEGATIVE mg/dL   Urobilinogen, UA 0.2  0.0 - 1.0 mg/dL   Nitrite NEGATIVE  NEGATIVE   Leukocytes, UA NEGATIVE  NEGATIVE  URINE MICROSCOPIC-ADD ON     Status: Abnormal   Collection Time   02/28/12  2:40 PM      Component Value Range   Squamous Epithelial / LPF FEW (*) RARE   RBC / HPF 0-2  <3 RBC/hpf   Bacteria, UA RARE  RARE  POCT PREGNANCY, URINE     Status: Normal   Collection Time   02/28/12  2:45 PM      Component Value Range   Preg Test, Ur NEGATIVE  NEGATIVE   MDM Patient drove herself to MAU.    Assessment and Plan   Assessment: Low back strain  Plan:   Ketorolac 60mg  IM x 1 now.   Flexeril 10mg  PO take as needed every 8 hours for discomfort.  #30. Follow up with orthopedics or [facility] as needed.   I agree with exam and documentation Nolene Bernheim, RN, FNP  Servando Salina 02/28/2012, 3:32 PM

## 2012-02-28 NOTE — MAU Note (Addendum)
Pain lower back, left side , LLQ pain, radiates to left leg, headache x 1 week, frontal, night sweats Wants blood preg test due to last pregnancy UPT did not show +, removed impant birth control in March

## 2012-02-28 NOTE — Discharge Instructions (Signed)
Follow up with an orthopedic doctor or the family practice outpatient clinic at [facility] for further back pain.  Back Pain, Adult Low back pain is very common. About 1 in 5 people have back pain.The cause of low back pain is rarely dangerous. The pain often gets better over time.About half of people with a sudden onset of back pain feel better in just 2 weeks. About 8 in 10 people feel better by 6 weeks.  CAUSES Some common causes of back pain include:  Strain of the muscles or ligaments supporting the spine.   Wear and tear (degeneration) of the spinal discs.   Arthritis.   Direct injury to the back.  DIAGNOSIS Most of the time, the direct cause of low back pain is not known.However, back pain can be treated effectively even when the exact cause of the pain is unknown.Answering your caregiver's questions about your overall health and symptoms is one of the most accurate ways to make sure the cause of your pain is not dangerous. If your caregiver needs more information, he or she may order lab work or imaging tests (X-rays or MRIs).However, even if imaging tests show changes in your back, this usually does not require surgery. HOME CARE INSTRUCTIONS For many people, back pain returns.Since low back pain is rarely dangerous, it is often a condition that people can learn to [facility] their own.   Remain active. It is stressful on the back to sit or stand in one place. Do not sit, drive, or stand in one place for more than 30 minutes at a time. Take short walks on level surfaces as soon as pain allows.Try to increase the length of time you walk each day.   Do not stay in bed.Resting more than 1 or 2 days can delay your recovery.   Do not avoid exercise or work.Your body is made to move.It is not dangerous to be active, even though your back may hurt.Your back will likely heal faster if you return to being active before your pain is gone.   Pay attention to your body when you bend  and lift. Many people have less discomfortwhen lifting if they bend their knees, keep the load close to their bodies,and avoid twisting. Often, the most comfortable positions are those that put less stress on your recovering back.   Find a comfortable position to sleep. Use a firm mattress and lie on your side with your knees slightly bent. If you lie on your back, put a pillow under your knees.   Only take over-the-counter or prescription medicines as directed by your caregiver. Over-the-counter medicines to reduce pain and inflammation are often the most helpful.Your caregiver may prescribe muscle relaxant drugs.These medicines help dull your pain so you can more quickly return to your normal activities and healthy exercise.   Put ice on the injured area.   Put ice in a plastic bag.   Place a towel between your skin and the bag.   Leave the ice on for 15 to 20 minutes, 3 to 4 times a day for the first 2 to 3 days. After that, ice and heat may be alternated to reduce pain and spasms.   Ask your caregiver about trying back exercises and gentle massage. This may be of some benefit.   Avoid feeling anxious or stressed.Stress increases muscle tension and can worsen back pain.It is important to recognize when you are anxious or stressed and learn ways to manage it.Exercise is a great option.  SEEK MEDICAL CARE IF:  You have pain that is not relieved with rest or medicine.   You have pain that does not improve in 1 week.   You have new symptoms.   You are generally not feeling well.  SEEK IMMEDIATE MEDICAL CARE IF:   You have pain that radiates from your back into your legs.   You develop new bowel or bladder control problems.   You have unusual weakness or numbness in your arms or legs.   You develop nausea or vomiting.   You develop abdominal pain.   You feel faint.  Document Released: 08/28/2005 Document Revised: 08/17/2011 Document Reviewed: 01/16/2011 [facility] Patient  Information 2012 [place], [place].

## 2012-03-09 ENCOUNTER — Emergency Department (HOSPITAL_COMMUNITY)
Admission: EM | Admit: 2012-03-09 | Discharge: 2012-03-09 | Disposition: A | Discharge status: Home / Self Care | Payer: Self-pay | Attending: Emergency Medicine | Admitting: Emergency Medicine

## 2012-03-09 ENCOUNTER — Emergency Department (HOSPITAL_COMMUNITY): Payer: Self-pay

## 2012-03-09 ENCOUNTER — Encounter (HOSPITAL_COMMUNITY): Payer: Self-pay | Admitting: *Deleted

## 2012-03-09 DIAGNOSIS — I10 Essential (primary) hypertension: Secondary | ICD-10-CM | POA: Insufficient documentation

## 2012-03-09 DIAGNOSIS — R109 Unspecified abdominal pain: Secondary | ICD-10-CM | POA: Insufficient documentation

## 2012-03-09 DIAGNOSIS — M549 Dorsalgia, unspecified: Secondary | ICD-10-CM | POA: Insufficient documentation

## 2012-03-09 DIAGNOSIS — N898 Other specified noninflammatory disorders of vagina: Secondary | ICD-10-CM | POA: Insufficient documentation

## 2012-03-09 DIAGNOSIS — Z79899 Other long term (current) drug therapy: Secondary | ICD-10-CM | POA: Insufficient documentation

## 2012-03-09 LAB — URINALYSIS, ROUTINE W REFLEX MICROSCOPIC
Glucose, UA: NEGATIVE mg/dL
Hgb urine dipstick: NEGATIVE
Leukocytes, UA: NEGATIVE
Specific Gravity, Urine: 1.025 (ref 1.005–1.030)
pH: 7.5 (ref 5.0–8.0)

## 2012-03-09 LAB — CBC WITH DIFFERENTIAL/PLATELET
Basophils Relative: 0 % (ref 0–1)
HCT: 36.5 % (ref 36.0–46.0)
Hemoglobin: 11.8 g/dL — ABNORMAL LOW (ref 12.0–15.0)
Lymphocytes Relative: 36 % (ref 12–46)
MCHC: 32.3 g/dL (ref 30.0–36.0)
Monocytes Relative: 8 % (ref 3–12)
Neutro Abs: 3.9 10*3/uL (ref 1.7–7.7)
Neutrophils Relative %: 54 % (ref 43–77)
RBC: 4.85 MIL/uL (ref 3.87–5.11)
WBC: 7.3 10*3/uL (ref 4.0–10.5)

## 2012-03-09 LAB — COMPREHENSIVE METABOLIC PANEL
AST: 15 U/L (ref 0–37)
Albumin: 3.6 g/dL (ref 3.5–5.2)
Alkaline Phosphatase: 48 U/L (ref 39–117)
BUN: 14 mg/dL (ref 6–23)
CO2: 24 mEq/L (ref 19–32)
Chloride: 102 mEq/L (ref 96–112)
GFR calc non Af Amer: >90 mL/min (ref >90)
Potassium: 3.9 mEq/L (ref 3.5–5.1)
Total Bilirubin: 0.2 mg/dL — ABNORMAL LOW (ref 0.3–1.2)

## 2012-03-09 LAB — POCT PREGNANCY, URINE: Preg Test, Ur: NEGATIVE

## 2012-03-09 LAB — WET PREP, GENITAL: Clue Cells Wet Prep HPF POC: NONE SEEN

## 2012-03-09 MED ORDER — SODIUM CHLORIDE 0.9 % IV BOLUS (SEPSIS)
1000.0000 mL | Freq: Once | INTRAVENOUS | Status: AC
  Administered 2012-03-09: 1000 mL via INTRAVENOUS

## 2012-03-09 MED ORDER — IOHEXOL 300 MG/ML  SOLN
100.0000 mL | Freq: Once | INTRAMUSCULAR | Status: AC | PRN
  Administered 2012-03-09: 100 mL via INTRAVENOUS

## 2012-03-09 MED ORDER — MORPHINE SULFATE 4 MG/ML IJ SOLN
4.0000 mg | Freq: Once | INTRAMUSCULAR | Status: DC
  Filled 2012-03-09: qty 1

## 2012-03-09 MED ORDER — ONDANSETRON HCL 4 MG/2ML IJ SOLN
4.0000 mg | Freq: Once | INTRAMUSCULAR | Status: DC
  Filled 2012-03-09: qty 2

## 2012-03-09 MED ORDER — ACETAMINOPHEN 325 MG PO TABS
ORAL_TABLET | ORAL | Status: AC
  Administered 2012-03-09: 650 mg
  Filled 2012-03-09: qty 2

## 2012-03-09 MED ORDER — IBUPROFEN 600 MG PO TABS: 600.0000 mg | ORAL_TABLET | Freq: Four times a day (QID) | ORAL | Status: AC | PRN

## 2012-03-09 MED ORDER — OXYCODONE-ACETAMINOPHEN 5-325 MG PO TABS: 2.0000 | ORAL_TABLET | ORAL | Status: AC | PRN

## 2012-03-09 MED ORDER — IOHEXOL 300 MG/ML  SOLN
20.0000 mL | INTRAMUSCULAR | Status: AC
  Administered 2012-03-09: 20 mL via ORAL

## 2012-03-09 NOTE — ED Notes (Signed)
Pt given po contrast to drink.

## 2012-03-09 NOTE — ED Provider Notes (Signed)
History     CSN: [id number]  Arrival date & time 03/09/12  1257   First MD Initiated Contact with Patient 03/09/12 1513      Chief Complaint  Patient presents with  . Abdominal Pain    (Consider location/radiation/quality/duration/timing/severity/associated sxs/prior treatment) HPI  PW back pain. Describes constant back pain x 2-3 weeks. No inciting event. Not worse with movement. Nothing makes it worse. Has taken ibuprofen with min relief. Has been taking flexeril as well with no relief.  Reports metallic taste in mouth x one week. Denies history of recent trauma/falls. Denies h/o malignancy, DM, immunocompromise injection drug use, immunosuppression, indwelling urinary catheter, prolonged steroid use, skin or urinary tract infection. No numbness/tingling/weakness of extremities. Denies fever/chills. Denies saddle anesthesia, no urinary incontinence or retention. Also complains of abdominal, diffuse lower abdominal pain. Has also been constant x 2-3 weeks. Denies distension. Describes it as burning. No vaginal discharge. Denies h/o STI-- she is currently sexually active. Recently treated for BV by [facility]. Denies hematuria/dysuria/+freq/ no urgency. Denies constipation, diarrhea.    ED Notes, ED Provider Notes from 03/09/12 0000 to 03/09/12 13:02:31       Arta Silence, RN 03/09/2012 13:01      Reports lower back and abd pain x 2 weeks, [facility]. Denies any vaginal discharge or itching, denies n/v/d.Marland Kitchen Reports frequent urination.    Past Medical History  Diagnosis Date  . Hypertension   . Anemia   . Heart murmur   . Morbid obesity     History reviewed. No pertinent past surgical history.  History reviewed. No pertinent family history.  History  Substance Use Topics  . Smoking status: Never Smoker   . Smokeless tobacco: Never Used  . Alcohol Use: No    OB History    Grav Para Term Preterm Abortions TAB SAB Ect Mult Living   3 3 3       3        Review of Systems  All other systems reviewed and are negative.  except as noted HPI   Allergies  Review of patient's allergies indicates no known allergies.  Home Medications   Current Outpatient Rx  Name Route Sig Dispense Refill  . CYCLOBENZAPRINE HCL 10 MG PO TABS Oral Take 10 mg by mouth 3 (three) times daily as needed. For muscle spasms.    Marland Kitchen FEXOFENADINE-PSEUDOEPHED ER 60-120 MG PO TB12 Oral Take 1 tablet by mouth 2 (two) times daily.    Marland Kitchen FLUTICASONE PROPIONATE 50 MCG/ACT NA SUSP Nasal Place 2 sprays into the nose daily.    . IBUPROFEN 200 MG PO TABS Oral Take 200-800 mg by mouth every 6 (six) hours as needed. headache    . LISINOPRIL 20 MG PO TABS Oral Take 20 mg by mouth daily.    . ADULT MULTIVITAMIN W/MINERALS CH Oral Take 1 tablet by mouth daily.    . IBUPROFEN 600 MG PO TABS Oral Take 1 tablet (600 mg total) by mouth every 6 (six) hours as needed for pain. 30 tablet 0  . OXYCODONE-ACETAMINOPHEN 5-325 MG PO TABS Oral Take 2 tablets by mouth every 4 (four) hours as needed for pain. 10 tablet 0    BP 140/76  Pulse 78  Temp 98.1 F (36.7 C) (Oral)  Resp 18  SpO2 100%  LMP 12/21/2011  Physical Exam  Nursing note and vitals reviewed. Constitutional: She is oriented to person, place, and time. She appears well-developed.  HENT:  Head: Atraumatic.  Mouth/Throat: Oropharynx is clear and moist.  Eyes: Conjunctivae and EOM are normal. Pupils are equal, round, and reactive to light.  Neck: Normal range of motion. Neck supple.  Cardiovascular: Normal rate, regular rhythm, normal heart sounds and intact distal pulses.   Pulmonary/Chest: Effort normal and breath sounds normal. No respiratory distress. She has no wheezes. She has no rales.  Abdominal: Soft. She exhibits no distension. There is tenderness. There is no rebound and no guarding.       Min diffuse lower abd ttp >LLQ  Genitourinary: Vaginal discharge found.       Min white vaginal discharge No CMT No  adnexal ttp  Musculoskeletal: Normal range of motion.  Neurological: She is alert and oriented to person, place, and time.  Skin: Skin is warm and dry. No rash noted.  Psychiatric: She has a normal mood and affect.    ED Course  Procedures (including critical care time)  Labs Reviewed  CBC WITH DIFFERENTIAL - Abnormal; Notable for the following:    Hemoglobin 11.8 (*)     MCV 75.3 (*)     MCH 24.3 (*)     All other components within normal limits  COMPREHENSIVE METABOLIC PANEL - Abnormal; Notable for the following:    Total Bilirubin 0.2 (*)     All other components within normal limits  WET PREP, GENITAL - Abnormal; Notable for the following:    WBC, Wet Prep HPF POC FEW (*)     All other components within normal limits  URINALYSIS, ROUTINE W REFLEX MICROSCOPIC  POCT PREGNANCY, URINE  GC/CHLAMYDIA PROBE AMP, GENITAL   Ct Abdomen Pelvis W Contrast  03/09/2012  *RADIOLOGY REPORT*  Clinical Data: Abdominal pain extending to the back.  CT ABDOMEN AND PELVIS WITH CONTRAST  Technique:  Multidetector CT imaging of the abdomen and pelvis was performed following the standard protocol during bolus administration of intravenous contrast.  Contrast:  100 ml Omnipaque-300  Comparison: Pelvic ultrasound January 2013  Findings: Lung bases are clear.  No pleural or pericardial fluid. The liver has a normal appearance without focal lesions or biliary ductal dilatation.  No calcified gallstones.  The spleen is normal. The pancreas is normal.  The adrenal glands are normal.  The kidneys are normal.  No cyst, mass, stone or hydronephrosis.  The aorta and IVC are normal.  No retroperitoneal mass or adenopathy. No free intraperitoneal fluid or air.  The appendix has a normal appearance.  No other bowel pathology.  No detectable uterine lesion.  Ovaries appear normal by CT.  No bony abnormality.  IMPRESSION: CT scan within normal limits.  No cause of pain identified.  Original Report Authenticated By: Thomasenia Sales, M.D.    1. Abdominal pain   2. Back pain     MDM  Abdominal pain with negative CT A/P, pelvic unremarkable. Back pain without red flags. Pain improved with tylenol in the ED. No EMC precluding discharge at this time. Given Precautions for return. PMD f/u.         Forbes Cellar, MD 03/10/12 [phone]

## 2012-03-09 NOTE — ED Notes (Signed)
Reports lower back and abd pain x 2 weeks, [facility]. Denies any vaginal discharge or itching, denies n/v/d.Marland Kitchen Reports frequent urination.

## 2012-03-09 NOTE — ED Notes (Signed)
Informed patient that md has signed up for her and awaiting md

## 2012-03-09 NOTE — ED Notes (Addendum)
Pt states she has been having lower abdominal pain that is radiating towards her lower back has been going on for about 2 weeks. Pt states pain is at a seven right now but states get worst at night. Pt states she took 4 200mg  ibuprofen about 2 hours ago. Pt states she was seen [facility] last wk and was prescribed muscle relaxer but pt states it does not work. Pt states she has been feeling nausated for 2 wks also after she eats but she has not vomited.

## 2012-03-09 NOTE — Discharge Instructions (Signed)
Abdominal Pain (Nonspecific) Your exam might not show the exact reason you have abdominal pain. Since there are many different causes of abdominal pain, another checkup and more tests may be needed. It is very important to follow up for lasting (persistent) or worsening symptoms. A possible cause of abdominal pain in any person who still has his or her appendix is acute appendicitis. Appendicitis is often hard to diagnose. Normal blood tests, urine tests, ultrasound, and CT scans do not completely rule out early appendicitis or other causes of abdominal pain. Sometimes, only the changes that happen over time will allow appendicitis and other causes of abdominal pain to be determined. Other potential problems that may require surgery may also take time to become more apparent. Because of this, it is important that you follow all of the instructions below. HOME CARE INSTRUCTIONS   Rest as much as possible.   Do not eat solid food until your pain is gone.   While adults or children have pain: A diet of water, weak decaffeinated tea, broth or bouillon, gelatin, oral rehydration solutions (ORS), frozen ice pops, or ice chips may be helpful.   When pain is gone in adults or children: Start a light diet (dry toast, crackers, applesauce, or white rice). Increase the diet slowly as long as it does not bother you. Eat no dairy products (including cheese and eggs) and no spicy, fatty, fried, or high-fiber foods.   Use no alcohol, caffeine, or cigarettes.   Take your regular medicines unless your caregiver told you not to.   Take any prescribed medicine as directed.   Only take over-the-counter or prescription medicines for pain, discomfort, or fever as directed by your caregiver. Do not give aspirin to children.  If your caregiver has given you a follow-up appointment, it is very important to keep that appointment. Not keeping the appointment could result in a permanent injury and/or lasting (chronic) pain  and/or disability. If there is any problem keeping the appointment, you must call to reschedule.  SEEK IMMEDIATE MEDICAL CARE IF:   Your pain is not gone in 24 hours.   Your pain becomes worse, changes location, or feels different.   You or your child has an oral temperature above 102 F (38.9 C), not controlled by medicine.   Your baby is older than 3 months with a rectal temperature of 102 F (38.9 C) or higher.   Your baby is 63 months old or younger with a rectal temperature of 100.4 F (38 C) or higher.   You have shaking chills.   You keep throwing up (vomiting) or cannot drink liquids.   There is blood in your vomit or you see blood in your bowel movements.   Your bowel movements become dark or black.   You have frequent bowel movements.   Your bowel movements stop (become blocked) or you cannot pass gas.   You have bloody, frequent, or painful urination.   You have yellow discoloration in the skin or whites of the eyes.   Your stomach becomes bloated or bigger.   You have dizziness or fainting.   You have chest or back pain.  MAKE SURE YOU:   Understand these instructions.   Will watch your condition.   Will get help right away if you are not doing well or get worse.  Document Released: 08/28/2005 Document Revised: 08/17/2011 Document Reviewed: 07/26/2009 [facility] Patient Information 2012 [place], [place].  Back Pain, Adult Low back pain is very common. About 1 in  5 people have back pain.The cause of low back pain is rarely dangerous. The pain often gets better over time.About half of people with a sudden onset of back pain feel better in just 2 weeks. About 8 in 10 people feel better by 6 weeks.  CAUSES Some common causes of back pain include:  Strain of the muscles or ligaments supporting the spine.   Wear and tear (degeneration) of the spinal discs.   Arthritis.   Direct injury to the back.  DIAGNOSIS Most of the time, the direct cause of low  back pain is not known.However, back pain can be treated effectively even when the exact cause of the pain is unknown.Answering your caregiver's questions about your overall health and symptoms is one of the most accurate ways to make sure the cause of your pain is not dangerous. If your caregiver needs more information, he or she may order lab work or imaging tests (X-rays or MRIs).However, even if imaging tests show changes in your back, this usually does not require surgery. HOME CARE INSTRUCTIONS For many people, back pain returns.Since low back pain is rarely dangerous, it is often a condition that people can learn to [facility] their own.   Remain active. It is stressful on the back to sit or stand in one place. Do not sit, drive, or stand in one place for more than 30 minutes at a time. Take short walks on level surfaces as soon as pain allows.Try to increase the length of time you walk each day.   Do not stay in bed.Resting more than 1 or 2 days can delay your recovery.   Do not avoid exercise or work.Your body is made to move.It is not dangerous to be active, even though your back may hurt.Your back will likely heal faster if you return to being active before your pain is gone.   Pay attention to your body when you bend and lift. Many people have less discomfortwhen lifting if they bend their knees, keep the load close to their bodies,and avoid twisting. Often, the most comfortable positions are those that put less stress on your recovering back.   Find a comfortable position to sleep. Use a firm mattress and lie on your side with your knees slightly bent. If you lie on your back, put a pillow under your knees.   Only take over-the-counter or prescription medicines as directed by your caregiver. Over-the-counter medicines to reduce pain and inflammation are often the most helpful.Your caregiver may prescribe muscle relaxant drugs.These medicines help dull your pain so you can more  quickly return to your normal activities and healthy exercise.   Put ice on the injured area.   Put ice in a plastic bag.   Place a towel between your skin and the bag.   Leave the ice on for 15 to 20 minutes, 3 to 4 times a day for the first 2 to 3 days. After that, ice and heat may be alternated to reduce pain and spasms.   Ask your caregiver about trying back exercises and gentle massage. This may be of some benefit.   Avoid feeling anxious or stressed.Stress increases muscle tension and can worsen back pain.It is important to recognize when you are anxious or stressed and learn ways to manage it.Exercise is a great option.  SEEK MEDICAL CARE IF:  You have pain that is not relieved with rest or medicine.   You have pain that does not improve in 1 week.   You  have new symptoms.   You are generally not feeling well.  SEEK IMMEDIATE MEDICAL CARE IF:   You have pain that radiates from your back into your legs.   You develop new bowel or bladder control problems.   You have unusual weakness or numbness in your arms or legs.   You develop nausea or vomiting.   You develop abdominal pain.   You feel faint.  Document Released: 08/28/2005 Document Revised: 08/17/2011 Document Reviewed: 01/16/2011 [facility] Patient Information 2012 [place], [place].  RESOURCE GUIDE  Dental Problems  Patients with Medicaid: [facility] [phone] [address]                                           [phone] [address] Phone:  [phone]                                                   Phone:  [phone]  If unable to pay or uninsured, contact:  [facility] or [facility] to become qualified for the adult dental clinic.  Chronic Pain Problems Contact [facility]  [phone] Patients need to be referred by their primary care doctor.  Insufficient Money for Medicine Contact [employer] Way:  call "211" or [facility] [phone].  No Primary Care Doctor Call [facility]  [phone] Other agencies that provide inexpensive medical care    [facility]  [phone]    [facility]  [phone]    [facility]  [phone]    [facility]  [phone]    [facility]  [phone]    [facility]  [phone]  Psychological Services [facility] Behavioral Health  [phone] [facility]  [phone] [facility]   [phone] (emergency services [phone])  Abuse/Neglect [facility] Child Abuse Hotline (726)693-5682 [facility] Child Abuse Hotline (279)709-7306 (After Hours)  Emergency Shelter [facility] [phone]  [facility] [place] [facility] (580)236-8919 [facility] [phone]  MRSA Hotline #:   734 087 2026    [facility] Resources  [facility]  [employer]                           [facility] Dept. [address] [place]                     [address]         [address]  [place]                                               Cristobal Goldmann Phone:  [phone]  Phone:  [phone]                   Phone:  [place] Phone:  [place] [phone] (After Hours)

## 2012-03-11 LAB — GC/CHLAMYDIA PROBE AMP, GENITAL: GC Probe Amp, Genital: NEGATIVE

## 2012-04-05 ENCOUNTER — Encounter (HOSPITAL_COMMUNITY): Payer: Self-pay | Admitting: *Deleted

## 2012-04-05 ENCOUNTER — Inpatient Hospital Stay (HOSPITAL_COMMUNITY)
Admission: AD | Admit: 2012-04-05 | Discharge: 2012-04-05 | Disposition: A | Discharge status: Home / Self Care | Payer: Self-pay | Source: Ambulatory Visit | Attending: Obstetrics & Gynecology | Admitting: Obstetrics & Gynecology

## 2012-04-05 ENCOUNTER — Inpatient Hospital Stay (HOSPITAL_COMMUNITY): Payer: Self-pay

## 2012-04-05 DIAGNOSIS — O26899 Other specified pregnancy related conditions, unspecified trimester: Secondary | ICD-10-CM

## 2012-04-05 DIAGNOSIS — R109 Unspecified abdominal pain: Secondary | ICD-10-CM

## 2012-04-05 DIAGNOSIS — O99891 Other specified diseases and conditions complicating pregnancy: Secondary | ICD-10-CM | POA: Insufficient documentation

## 2012-04-05 LAB — URINE MICROSCOPIC-ADD ON

## 2012-04-05 LAB — URINALYSIS, ROUTINE W REFLEX MICROSCOPIC
Bilirubin Urine: NEGATIVE
Glucose, UA: NEGATIVE mg/dL
Ketones, ur: NEGATIVE mg/dL
Protein, ur: NEGATIVE mg/dL

## 2012-04-05 MED ORDER — LABETALOL HCL 100 MG PO TABS: 200.0000 mg | ORAL_TABLET | Freq: Two times a day (BID) | ORAL | Status: DC

## 2012-04-05 NOTE — MAU Note (Addendum)
Lower abd pain x 2 wks.  Did preg test today, was positive.  Wants confirmation.  Has had headache past 3 days,  Took Advil (advised to stop advil if preg- only take tylenol), some relief.  Concerned it is from BP, does not take meds consistantly.

## 2012-04-05 NOTE — MAU Provider Note (Signed)
History     CSN: [id number]  Arrival date and time: 04/05/12 1447   First Provider Initiated Contact with Patient 04/05/12 1730      Chief Complaint  Patient presents with  . Abdominal Pain   HPI  Pt reports intermittent pelvic pain that started a few weeks ago.  Pt denies vaginal bleeding, abnormal vaginal discharge, or UTI symptoms.  Past Medical History  Diagnosis Date  . Hypertension   . Anemia   . Heart murmur   . Morbid obesity     Past Surgical History  Procedure Date  . No past surgeries     Family History  Problem Relation Age of Onset  . Other Neg Hx     History  Substance Use Topics  . Smoking status: Never Smoker   . Smokeless tobacco: Never Used  . Alcohol Use: No    Allergies:  Allergies  Allergen Reactions  . Kiwi Extract Nausea Only  . Latex Itching    Prescriptions prior to admission  Medication Sig Dispense Refill  . cyclobenzaprine (FLEXERIL) 10 MG tablet Take 10 mg by mouth 3 (three) times daily as needed. For muscle spasms.      . fluticasone (FLONASE) 50 MCG/ACT nasal spray Place 2 sprays into the nose daily as needed. For allergies/congestion      . ibuprofen (ADVIL,MOTRIN) 200 MG tablet Take 200-800 mg by mouth every 6 (six) hours as needed. For headache or pain      . lisinopril (PRINIVIL,ZESTRIL) 20 MG tablet Take 20 mg by mouth daily.      . Multiple Vitamin (MULITIVITAMIN WITH MINERALS) TABS Take 1 tablet by mouth daily.      Marland Kitchen omeprazole (PRILOSEC) 20 MG capsule Take 20 mg by mouth daily as needed. For nausea or heartburn        Review of Systems  Gastrointestinal: Positive for nausea and abdominal pain. Negative for vomiting.  All other systems reviewed and are negative.   Physical Exam   Blood pressure 139/79, pulse 87, temperature 98.5 F (36.9 C), temperature source Oral, resp. rate 20, height 5\' 5"  (1.651 m), weight 113.399 kg (250 lb), last menstrual period 02/05/2012.  Physical Exam  Constitutional: She is  oriented to person, place, and time. She appears well-developed and well-nourished.  HENT:  Head: Normocephalic.  Neck: Normal range of motion. Neck supple.  Cardiovascular: Normal rate, regular rhythm and normal heart sounds.   Respiratory: Effort normal and breath sounds normal. No respiratory distress.  GI: Soft. There is no tenderness (lower midpelvic).  Musculoskeletal: Normal range of motion.  Neurological: She is alert and oriented to person, place, and time. She has normal reflexes.  Skin: Skin is warm and dry.    MAU Course  Procedures Results for orders placed during the hospital encounter of 04/05/12 (from the past 24 hour(s))  POCT PREGNANCY, URINE     Status: Abnormal   Collection Time   04/05/12  4:42 PM      Component Value Range   Preg Test, Ur POSITIVE (*) NEGATIVE  HCG, QUANTITATIVE, PREGNANCY     Status: Abnormal   Collection Time   04/05/12  5:28 PM      Component Value Range   hCG, Beta Chain, Quant, S 31452 (*) <5 mIU/mL     Ultrasound: IMPRESSION:  Live single intrauterine gestation with an estimated gestational  age of [redacted] weeks and 3 days. Fetal heart rate is 153 beats per  minute. Small subchorionic hemorrhage.   Assessment and  Plan  Intrauterine Pregnancy Abdominal Pain in Pregnancy  Plan: DC to home Bleeding precautions Begin prenatal care as soon as possible.  [facility] 04/05/2012, 5:32 PM

## 2012-04-05 NOTE — MAU Note (Addendum)
Irregular cycles.  Had implanon, was removed March 6, had period Apirl 11, May 27- none since. Neg preg test end of June.

## 2012-04-26 ENCOUNTER — Other Ambulatory Visit: Payer: Self-pay

## 2012-04-26 DIAGNOSIS — Z331 Pregnant state, incidental: Secondary | ICD-10-CM

## 2012-04-26 NOTE — Progress Notes (Signed)
PRENATAL LABS DONE TODAY [place] 

## 2012-04-27 LAB — OBSTETRIC PANEL
Antibody Screen: NEGATIVE
Basophils Absolute: 0 10*3/uL (ref 0.0–0.1)
HCT: 34.1 % — ABNORMAL LOW (ref 36.0–46.0)
Hemoglobin: 11.2 g/dL — ABNORMAL LOW (ref 12.0–15.0)
Lymphocytes Relative: 33 % (ref 12–46)
Monocytes Absolute: 0.7 10*3/uL (ref 0.1–1.0)
Neutro Abs: 4 10*3/uL (ref 1.7–7.7)
RDW: 14.3 % (ref 11.5–15.5)
Rubella: 364.9 IU/mL — ABNORMAL HIGH
WBC: 7.1 10*3/uL (ref 4.0–10.5)

## 2012-04-27 LAB — CULTURE, OB URINE: Organism ID, Bacteria: NO GROWTH

## 2012-04-29 ENCOUNTER — Encounter: Payer: Self-pay | Admitting: Obstetrics & Gynecology

## 2012-05-03 ENCOUNTER — Encounter: Payer: Self-pay | Admitting: Family Medicine

## 2012-05-07 ENCOUNTER — Ambulatory Visit (INDEPENDENT_AMBULATORY_CARE_PROVIDER_SITE_OTHER): Payer: Self-pay | Admitting: Family Medicine

## 2012-05-07 ENCOUNTER — Encounter: Payer: Self-pay | Admitting: Family Medicine

## 2012-05-07 VITALS — BP 142/85 | Temp 98.3°F | Wt 250.0 lb

## 2012-05-07 DIAGNOSIS — Z3490 Encounter for supervision of normal pregnancy, unspecified, unspecified trimester: Secondary | ICD-10-CM

## 2012-05-07 DIAGNOSIS — O99810 Abnormal glucose complicating pregnancy: Secondary | ICD-10-CM

## 2012-05-07 DIAGNOSIS — O09899 Supervision of other high risk pregnancies, unspecified trimester: Secondary | ICD-10-CM

## 2012-05-07 DIAGNOSIS — Z3480 Encounter for supervision of other normal pregnancy, unspecified trimester: Secondary | ICD-10-CM

## 2012-05-07 DIAGNOSIS — I10 Essential (primary) hypertension: Secondary | ICD-10-CM

## 2012-05-07 DIAGNOSIS — O24419 Gestational diabetes mellitus in pregnancy, unspecified control: Secondary | ICD-10-CM

## 2012-05-07 LAB — BASIC METABOLIC PANEL
CO2: 22 mEq/L (ref 19–32)
Chloride: 104 mEq/L (ref 96–112)
Creat: 0.49 mg/dL — ABNORMAL LOW (ref 0.50–1.10)
Potassium: 3.5 mEq/L (ref 3.5–5.3)
Sodium: 136 mEq/L (ref 135–145)

## 2012-05-07 LAB — GLUCOSE, CAPILLARY: Comment 1: 1

## 2012-05-07 MED ORDER — PRENATAL VITAMINS 0.8 MG PO TABS: 1.0000 | ORAL_TABLET | Freq: Every day | ORAL | Status: DC

## 2012-05-07 MED ORDER — PRENATAL VITAMINS PLUS 27-1 MG PO TABS: 1.0000 | ORAL_TABLET | Freq: Every day | ORAL | Status: DC

## 2012-05-07 NOTE — Patient Instructions (Addendum)
We will refer to [facility]   Let us know if you have not heard from them in the next 1-2 weeks  After collecting your urine for 24 hours, please bring the sample back to our clinic  If your depression worsens, return to our clinic or the Ob clinic if you have already been seen by them  If your lab results are normal, I will send you a letter with the results. If abnormal, someone at the clinic will get in touch with you.

## 2012-05-07 NOTE — Progress Notes (Incomplete)
Failed 1-hour glucola. Scheduled for 3-hour.

## 2012-05-07 NOTE — Addendum Note (Signed)
Addended by: Damita Lack on: 05/07/2012 04:20 PM   Modules accepted: Orders

## 2012-05-07 NOTE — Progress Notes (Signed)
This is a 31 year old [id number] presenting for her initial obstetrical visit.  Her past medical history is significant for obesity, chronic hypertension, and GERD. Her OB history is significant for G2 and G3 being induced at around 34 weeks due to hypertension. She also had diet controlled gestational diabetes her last pregnancy (G3). She is currently on labetalol.   She also reports depression with decreased appetite and difficulty sleeping. She moved from [place] in 2004 due to her boyfriend's (her babies' father's) job.  The pregnancy was expected. She wishes that she had more friends and activities to do during the day instead of just staying at home, watching television and her children.  ROS: denies SI/HI  O:  GEN: NAD PSYCH: engaged, appropriate to questions, not anxious-appearing, mildly depressed-appearing NECK: no thyromegaly CV: RRR PULM: NI WOB; CTAB ABD: soft, NT, obese EXT: no edema  A/P: This is a 31 year old [id number] presenting for her initial obstetrical visit at 12.0 weeks. She has a history of GERD, chronic hypertension and is now on labetalol, and obesity. Her history today is concerning for depression.  -Will perform 1-hour glucola today due to obesity risk factor and history of gestational diabetes in G3.  -Will check BMET and gather 24-hour urine  -Discussed initial obstetrical labs. Normal except for Hgb 11. Will change from regular vitamins to prenatal vitamins -Will check Varicella titers due to denying history of exposure and vaccination  -Will refer to high risk Ob clinic due to chronic hypertension and history of prior inductions for gestational hypertension  -Depression. PH9-Q 13/27. Discussed starting anti-depressant versus therapy versus monitoring depression. She would like to monitor for now.

## 2012-05-07 NOTE — Addendum Note (Signed)
Addended by: Garen Grams F on: 05/07/2012 10:46 AM   Modules accepted: Orders

## 2012-05-09 ENCOUNTER — Other Ambulatory Visit: Payer: Self-pay

## 2012-05-09 ENCOUNTER — Ambulatory Visit: Payer: Self-pay | Admitting: Family Medicine

## 2012-05-09 ENCOUNTER — Ambulatory Visit (INDEPENDENT_AMBULATORY_CARE_PROVIDER_SITE_OTHER): Payer: Self-pay | Admitting: Family Medicine

## 2012-05-09 VITALS — BP 134/77 | Temp 99.2°F

## 2012-05-09 DIAGNOSIS — R51 Headache: Secondary | ICD-10-CM

## 2012-05-09 DIAGNOSIS — Z3490 Encounter for supervision of normal pregnancy, unspecified, unspecified trimester: Secondary | ICD-10-CM

## 2012-05-09 DIAGNOSIS — Z331 Pregnant state, incidental: Secondary | ICD-10-CM

## 2012-05-09 DIAGNOSIS — R809 Proteinuria, unspecified: Secondary | ICD-10-CM

## 2012-05-09 DIAGNOSIS — R519 Headache, unspecified: Secondary | ICD-10-CM | POA: Insufficient documentation

## 2012-05-09 DIAGNOSIS — I10 Essential (primary) hypertension: Secondary | ICD-10-CM

## 2012-05-09 LAB — COMPREHENSIVE METABOLIC PANEL
Albumin: 3.9 g/dL (ref 3.5–5.2)
BUN: 4 mg/dL — ABNORMAL LOW (ref 6–23)
CO2: 23 mEq/L (ref 19–32)
Calcium: 8.7 mg/dL (ref 8.4–10.5)
Chloride: 104 mEq/L (ref 96–112)
Creat: 0.47 mg/dL — ABNORMAL LOW (ref 0.50–1.10)

## 2012-05-09 MED ORDER — HYDROCODONE-ACETAMINOPHEN 5-500 MG PO TABS: 1.0000 | ORAL_TABLET | Freq: Four times a day (QID) | ORAL | Status: AC | PRN

## 2012-05-09 NOTE — Assessment & Plan Note (Addendum)
BP 134/77, on labetolol 400mg  BID and taking it.

## 2012-05-09 NOTE — Assessment & Plan Note (Addendum)
BP 134/77, on labetolol 400mg  BID and taking it.  Here for 3hr GTT but also c/o headache.  ?tension headache.  Unable to give anything other than tylenol (which hasn't worked) or narcotics.  Will give Rx for vicodin. Red flags for MAU visit discussed. Also likely a dehydration component as pt has not been eating/drinking much for the past 2 weeks.  Checked CMET.

## 2012-05-09 NOTE — Progress Notes (Signed)
Here for 3hr GTT but also c/o headache since yesterday afternoon.  Started when she was sitting and watching TV.  Does not have a h/o HA or migraines.  Did have vomiting x1 last night because of pain, but does not complain of nausea currently.  Decreased po x2 weeks because of decreased appetite.  Has not been eating/drinking well.  Had a little bit of dizziness this AM.  No fevers or chills, no cough/rhinorrhea.  Does have "clogged" feeling in ears.  Pain is on the left frontal/temporal area of head.  No vision changes, no RUQ pain, no LE edema.  +phonophobia, no photophobia.  Tried tylenol yesterday, which didn't really help.  Does feel some pressure behind her left eye.  Has not missed any of her labetolol doses.  PE: vitals stable Gen: NAD HEENT: slightly dry MM, TMs normal B, no pharyngeal erythema or exudate, nasal mucosa normal, no sinus TTP; optho exam appears normal CV: RRR, 2/6 systolic murmur loudest at sternum Pulm: clear Abd: soft, NT, + FHTs Ext: no edema Neuro: CN 2-12 grossly intact, strength and sensation intact throughout  A/p: see problem list

## 2012-05-09 NOTE — Patient Instructions (Addendum)
I'm sorry you're having this headache.  I gave you a prescription for a strong medicine that should make this headache go away.  But you also need to make sure you are eating and drinking plenty of water to help as well.  I will call you if there is anything abnormal on the labs that we drew.  If you headache gets worse, you have more vomiting or dizziness, or you overall just feel really badly, go to the MAU.

## 2012-05-09 NOTE — Progress Notes (Signed)
3 HR GTT DONE TODAY AND PT DROPPED OFF 24 HR URINE Latasha Simmons

## 2012-05-10 ENCOUNTER — Encounter: Payer: Self-pay | Admitting: Family Medicine

## 2012-05-10 DIAGNOSIS — R809 Proteinuria, unspecified: Secondary | ICD-10-CM | POA: Insufficient documentation

## 2012-05-10 LAB — GLUCOSE TOLERANCE, 3 HOURS
Glucose Tolerance, 1 hour: 181 mg/dL (ref 70–189)
Glucose Tolerance, 2 hour: 157 mg/dL (ref 70–164)
Glucose Tolerance, Fasting: 87 mg/dL (ref 70–104)
Glucose, GTT - 3 Hour: 131 mg/dL (ref 70–144)

## 2012-05-10 NOTE — Progress Notes (Signed)
2 encounters for same pt same day. Please see other encounter.

## 2012-05-10 NOTE — Assessment & Plan Note (Signed)
Does not quite meet criteria for nephrotic range proteinuria but it is elevated.  She does not appear preeclamptic at this time. However, her history of chronic blood pressure and baseline proteinuria should be closely monitored during her pregnancy.  She has been referred to high risk Ob clinic but does not have an appointment made at this time. I have called patient and left message advising her to call our clinic back if no appointment has been arranged in 1 week. Signs of preeclampsia given and patient asked to go to MAU if she has severe headache, significant swelling or significantly elevated blood pressure.

## 2012-05-10 NOTE — Addendum Note (Signed)
Addended by: Madolyn Frieze, Marylene Land J on: 05/10/2012 02:13 PM   Modules accepted: Orders

## 2012-05-14 ENCOUNTER — Telehealth: Payer: Self-pay | Admitting: Family Medicine

## 2012-05-14 DIAGNOSIS — O09899 Supervision of other high risk pregnancies, unspecified trimester: Secondary | ICD-10-CM | POA: Insufficient documentation

## 2012-05-14 DIAGNOSIS — O24919 Unspecified diabetes mellitus in pregnancy, unspecified trimester: Secondary | ICD-10-CM | POA: Insufficient documentation

## 2012-05-14 NOTE — Telephone Encounter (Signed)
Home phone does not accept incoming calls LVM with mobile phone, notifying of abnormal 3-hr GTT signifying gestational diabetes. Encouraged her to make follow-up appointment at [facility]. She did have appointment 08/23, but this was cancelled.

## 2012-05-14 NOTE — Progress Notes (Signed)
Note reviewed.  Pt to be transferred to high risk for pregnancy complicated by chronic hypertension and diabetes dx by 3 hour GTT.   Pt also with following issues in pregnancy: Depressive disorder - followed clinically for now.  Socially isolated immigrant.  Declined medication. Obesity - Pt should gain only 15-20 lbs in pregnancy. Potential teratogens - pt had abdominal CT scan on 03/09/12 at 5 and 4/7 gestaional age.  As pregnancy survived, is unlikely to have effects.  Also with first trimester lisinopril exposure until 7 and 3/7.  As she was switched to B-blocker before second trimester, unlikely to have effects from this exposure.  Pt may want genetics appt to discuss further.

## 2012-05-15 ENCOUNTER — Encounter: Payer: Self-pay | Admitting: Obstetrics & Gynecology

## 2012-05-29 ENCOUNTER — Encounter (HOSPITAL_COMMUNITY): Payer: Self-pay

## 2012-05-29 ENCOUNTER — Inpatient Hospital Stay (HOSPITAL_COMMUNITY)
Admission: AD | Admit: 2012-05-29 | Discharge: 2012-05-29 | Disposition: A | Discharge status: Home / Self Care | Payer: Self-pay | Source: Ambulatory Visit | Attending: Obstetrics & Gynecology | Admitting: Obstetrics & Gynecology

## 2012-05-29 DIAGNOSIS — H6692 Otitis media, unspecified, left ear: Secondary | ICD-10-CM

## 2012-05-29 DIAGNOSIS — R51 Headache: Secondary | ICD-10-CM | POA: Insufficient documentation

## 2012-05-29 DIAGNOSIS — Z331 Pregnant state, incidental: Secondary | ICD-10-CM

## 2012-05-29 DIAGNOSIS — O210 Mild hyperemesis gravidarum: Secondary | ICD-10-CM | POA: Insufficient documentation

## 2012-05-29 DIAGNOSIS — O99891 Other specified diseases and conditions complicating pregnancy: Secondary | ICD-10-CM | POA: Insufficient documentation

## 2012-05-29 DIAGNOSIS — H6690 Otitis media, unspecified, unspecified ear: Secondary | ICD-10-CM | POA: Insufficient documentation

## 2012-05-29 LAB — URINE MICROSCOPIC-ADD ON

## 2012-05-29 LAB — URINALYSIS, ROUTINE W REFLEX MICROSCOPIC
Ketones, ur: 40 mg/dL — AB
Leukocytes, UA: NEGATIVE
Nitrite: NEGATIVE
Specific Gravity, Urine: 1.025 (ref 1.005–1.030)
Urobilinogen, UA: 0.2 mg/dL (ref 0.0–1.0)
pH: 6 (ref 5.0–8.0)

## 2012-05-29 MED ORDER — IBUPROFEN 600 MG PO TABS: 600.0000 mg | ORAL_TABLET | Freq: Four times a day (QID) | ORAL | Status: DC | PRN

## 2012-05-29 MED ORDER — IBUPROFEN 800 MG PO TABS
800.0000 mg | ORAL_TABLET | Freq: Once | ORAL | Status: AC
  Administered 2012-05-29: 800 mg via ORAL
  Filled 2012-05-29: qty 1

## 2012-05-29 MED ORDER — AZITHROMYCIN 500 MG PO TABS: 500.0000 mg | ORAL_TABLET | Freq: Every day | ORAL | Status: DC

## 2012-05-29 MED ORDER — ONDANSETRON 8 MG PO TBDP
8.0000 mg | ORAL_TABLET | Freq: Once | ORAL | Status: AC
  Administered 2012-05-29: 8 mg via ORAL
  Filled 2012-05-29: qty 1

## 2012-05-29 MED ORDER — AZITHROMYCIN 250 MG PO TABS
500.0000 mg | ORAL_TABLET | Freq: Once | ORAL | Status: AC
  Administered 2012-05-29: 500 mg via ORAL
  Filled 2012-05-29: qty 2

## 2012-05-29 NOTE — MAU Note (Signed)
Left sided headache that started yesterday, not relieved by tylenol.  Left earache since yesterday; both ears feel like there is water in them. Nausea throughout pregnancy but states is worse today b/c she hasn't been able to eat. Denies abdominal pain, denies vaginal bleeding.

## 2012-05-29 NOTE — MAU Provider Note (Signed)
Chief Complaint: Headache, Otalgia and Morning Sickness   None    SUBJECTIVE HPI: Latasha Simmons is a 31 y.o. [id number] at [redacted]w[redacted]d by LMP who presents to MAU reporting: 1. Left sided headache that started yesterday, not relieved by tylenol.  2. Left earache since yesterday 3. Feeling of fluid in both ears  4. Nausea throughout pregnancy, worse today. No vomiting.   Denies abdominal pain, vaginal bleeding, fever, chills.    Past Medical History  Diagnosis Date  . Hypertension   . Anemia   . Heart murmur   . Morbid obesity    OB History    Grav Para Term Preterm Abortions TAB SAB Ect Mult Living   4 3 1 2      3      # Outc Date GA Lbr Len/2nd Wgt Sex Del Anes PTL Lv   1 TRM      SVD   Yes   2 PRE  [redacted]w[redacted]d    SVD   Yes   Comments: Induced for high blood pressure    3 PRE  [redacted]w[redacted]d    SVD   Yes   Comments: Induced for high blood pressure    4 CUR              Past Surgical History  Procedure Date  . No past surgeries    History   Social History  . Marital Status: Single    Spouse Name: N/A    Number of Children: N/A  . Years of Education: N/A   Occupational History  . Not on file.   Social History Main Topics  . Smoking status: Never Smoker   . Smokeless tobacco: Never Used  . Alcohol Use: No  . Drug Use: No  . Sexually Active: Yes    Birth Control/ Protection: None   Other Topics Concern  . Not on file   Social History Narrative  . No narrative on file   No current facility-administered medications on file prior to encounter.   Current Outpatient Prescriptions on File Prior to Encounter  Medication Sig Dispense Refill  . labetalol (NORMODYNE) 100 MG tablet Take 2 tablets (200 mg total) by mouth 2 (two) times daily.  60 tablet  2  . Prenatal Vit-Fe Fumarate-FA (PRENATAL VITAMINS PLUS) 27-1 MG TABS Take 1 tablet by mouth daily.  30 tablet  11  . fluticasone (FLONASE) 50 MCG/ACT nasal spray Place 2 sprays into the nose daily as needed. For  allergies/congestion      . Multiple Vitamin (MULITIVITAMIN WITH MINERALS) TABS Take 1 tablet by mouth daily.      Marland Kitchen omeprazole (PRILOSEC) 20 MG capsule Take 20 mg by mouth daily as needed. For nausea or heartburn      . DISCONTD: cetirizine (ZYRTEC) 10 MG tablet Take 10 mg by mouth daily.      Marland Kitchen DISCONTD: loratadine (CLARITIN) 10 MG tablet Take 10 mg by mouth daily.       Allergies  Allergen Reactions  . Kiwi Extract Nausea Only  . Latex Itching    ROS: Pertinent items in HPI  OBJECTIVE Blood pressure 141/74, pulse 80, temperature 98.3 F (36.8 C), temperature source Oral, resp. rate 18, height 5\' 6"  (1.676 m), last menstrual period 02/05/2012. GENERAL: Well-developed, well-nourished female in mild distress.  HEENT: Normocephalic. Left tympanic membrane bulging, injected. Purulent effusion. Bony landmarks obscured by fluid. Right tympanic membrane bulging, not injected, non-purulent effusion. Nasal congestion. Frontal and maxillary sinuses NT.   HEART: normal rate RESP:  normal effort ABDOMEN: Soft, non-tender EXTREMITIES: Nontender, no edema NEURO: Alert and oriented SPECULUM EXAM: deferred FHR 150's by informal BS [place]. Fetus very active.   LAB RESULTS Results for orders placed during the hospital encounter of 05/29/12 (from the past 24 hour(s))  URINALYSIS, ROUTINE W REFLEX MICROSCOPIC     Status: Abnormal   Collection Time   05/29/12  8:48 PM      Component Value Range   Color, Urine YELLOW  YELLOW   APPearance HAZY (*) CLEAR   Specific Gravity, Urine 1.025  1.005 - 1.030   pH 6.0  5.0 - 8.0   Glucose, UA NEGATIVE  NEGATIVE mg/dL   Hgb urine dipstick SMALL (*) NEGATIVE   Bilirubin Urine NEGATIVE  NEGATIVE   Ketones, ur 40 (*) NEGATIVE mg/dL   Protein, ur NEGATIVE  NEGATIVE mg/dL   Urobilinogen, UA 0.2  0.0 - 1.0 mg/dL   Nitrite NEGATIVE  NEGATIVE   Leukocytes, UA NEGATIVE  NEGATIVE  URINE MICROSCOPIC-ADD ON     Status: Abnormal   Collection Time   05/29/12  8:48 PM       Component Value Range   Squamous Epithelial / LPF FEW (*) RARE   WBC, UA 0-2  <3 WBC/hpf   RBC / HPF 0-2  <3 RBC/hpf   Bacteria, UA FEW (*) RARE   Urine-Other MUCOUS PRESENT      IMAGING No results found.  ED COURSE Ibuprofen 800 mg and Azithromycin 500 mg given. HA and ear pain resolved.  ASSESSMENT 1. Acute otitis media, left   2. Pregnancy, incidental    PLAN Discharge home. Follow-up Information    Follow up with [facility]. (as scheduled)    Contact information:   [address] [place] [address] [phone]          Medication List     As of 05/30/2012 12:10 AM    STOP taking these medications         multivitamin with minerals Tabs      TAKE these medications         acetaminophen 500 MG tablet   Commonly known as: TYLENOL   Take 500 mg by mouth every 6 (six) hours as needed.      azithromycin 500 MG tablet   Commonly known as: ZITHROMAX   Take 1 tablet (500 mg total) by mouth daily.      fluticasone 50 MCG/ACT nasal spray   Commonly known as: FLONASE   Place 2 sprays into the nose daily as needed. For allergies/congestion      HYDROcodone-acetaminophen 5-500 MG per tablet   Commonly known as: VICODIN   Take 1 tablet by mouth every 6 (six) hours as needed. For headaches.      ibuprofen 600 MG tablet   Commonly known as: ADVIL,MOTRIN   Take 1 tablet (600 mg total) by mouth every 6 (six) hours as needed for pain. Do not take after [redacted] weeks gestation.      labetalol 100 MG tablet   Commonly known as: NORMODYNE   Take 2 tablets (200 mg total) by mouth 2 (two) times daily.      omeprazole 20 MG capsule   Commonly known as: PRILOSEC   Take 20 mg by mouth daily as needed. For nausea or heartburn      PRENATAL VITAMINS PLUS 27-1 MG Tabs   Take 1 tablet by mouth daily.         [place], [place] 05/29/2012  9:18 PM

## 2012-06-03 ENCOUNTER — Ambulatory Visit (INDEPENDENT_AMBULATORY_CARE_PROVIDER_SITE_OTHER): Payer: Self-pay | Admitting: Obstetrics & Gynecology

## 2012-06-03 ENCOUNTER — Encounter: Payer: Self-pay | Admitting: Obstetrics & Gynecology

## 2012-06-03 VITALS — BP 134/85 | Temp 100.0°F | Wt 251.6 lb

## 2012-06-03 DIAGNOSIS — O0990 Supervision of high risk pregnancy, unspecified, unspecified trimester: Secondary | ICD-10-CM

## 2012-06-03 DIAGNOSIS — Z23 Encounter for immunization: Secondary | ICD-10-CM

## 2012-06-03 DIAGNOSIS — O169 Unspecified maternal hypertension, unspecified trimester: Secondary | ICD-10-CM

## 2012-06-03 LAB — POCT URINALYSIS DIP (DEVICE)
Bilirubin Urine: NEGATIVE
Glucose, UA: NEGATIVE mg/dL
Nitrite: NEGATIVE
Protein, ur: NEGATIVE mg/dL
Specific Gravity, Urine: 1.015 (ref 1.005–1.030)
pH: 7.5 (ref 5.0–8.0)

## 2012-06-03 LAB — COMPREHENSIVE METABOLIC PANEL
ALT: 10 U/L (ref 0–35)
Albumin: 3.9 g/dL (ref 3.5–5.2)
CO2: 23 mEq/L (ref 19–32)
Chloride: 106 mEq/L (ref 96–112)
Glucose, Bld: 77 mg/dL (ref 70–99)
Potassium: 3.9 mEq/L (ref 3.5–5.3)
Sodium: 137 mEq/L (ref 135–145)
Total Bilirubin: 0.2 mg/dL — ABNORMAL LOW (ref 0.3–1.2)
Total Protein: 6.7 g/dL (ref 6.0–8.3)

## 2012-06-03 LAB — TSH: TSH: 0.981 u[IU]/mL (ref 0.350–4.500)

## 2012-06-03 MED ORDER — INFLUENZA VIRUS VACC SPLIT PF IM SUSP
0.5000 mL | Freq: Once | INTRAMUSCULAR | Status: AC
  Administered 2012-06-03: 0.5 mL via INTRAMUSCULAR

## 2012-06-03 NOTE — Progress Notes (Signed)
Pulse 94 Patient reports back pain and daily headaches with some blurry vision. Patient also reports some SOB

## 2012-06-03 NOTE — Addendum Note (Signed)
Addended by: Franchot Mimes on: 06/03/2012 04:42 PM   Modules accepted: Orders

## 2012-06-03 NOTE — Progress Notes (Signed)
She is here in the gyn clinic today for her High Risk OB appt. Because of her HTN, DM, I will order TSH, 24 hour urine tests. I will schedule an anatomy U/S, give her her flu vaccine. She denies OB problems. She declines Quad screen. She does have a h/o "ulcer" and only takes her omeprazole on a prn basis. I have suggested that she take it on a daily basis as she complains of "burning" causing her anorexia.

## 2012-06-07 LAB — COMPREHENSIVE METABOLIC PANEL
AST: 11 U/L (ref 0–37)
Albumin: 3.9 g/dL (ref 3.5–5.2)
Alkaline Phosphatase: 31 U/L — ABNORMAL LOW (ref 39–117)
Glucose, Bld: 82 mg/dL (ref 70–99)
Potassium: 3.8 mEq/L (ref 3.5–5.3)
Sodium: 136 mEq/L (ref 135–145)
Total Bilirubin: 0.3 mg/dL (ref 0.3–1.2)
Total Protein: 6.3 g/dL (ref 6.0–8.3)

## 2012-06-07 NOTE — Addendum Note (Signed)
Addended by: Franchot Mimes on: 06/07/2012 10:10 AM   Modules accepted: Orders

## 2012-06-08 LAB — PROTEIN, URINE, 24 HOUR: Protein, 24H Urine: 192 mg/d — ABNORMAL HIGH (ref 50–100)

## 2012-06-08 LAB — CREATININE CLEARANCE, URINE, 24 HOUR
Creatinine Clearance: 359 mL/min — ABNORMAL HIGH (ref 75–115)
Creatinine, 24H Ur: 2530 mg/d — ABNORMAL HIGH (ref 700–1800)
Creatinine: 0.49 mg/dL — ABNORMAL LOW (ref 0.50–1.10)

## 2012-06-17 ENCOUNTER — Ambulatory Visit (INDEPENDENT_AMBULATORY_CARE_PROVIDER_SITE_OTHER): Payer: Self-pay | Admitting: Family Medicine

## 2012-06-17 ENCOUNTER — Encounter: Payer: Self-pay | Admitting: Family Medicine

## 2012-06-17 ENCOUNTER — Encounter: Payer: Self-pay | Attending: Family Medicine | Admitting: Dietician

## 2012-06-17 VITALS — BP 144/86 | Temp 98.7°F | Wt 249.8 lb

## 2012-06-17 DIAGNOSIS — O10019 Pre-existing essential hypertension complicating pregnancy, unspecified trimester: Secondary | ICD-10-CM

## 2012-06-17 DIAGNOSIS — O09299 Supervision of pregnancy with other poor reproductive or obstetric history, unspecified trimester: Secondary | ICD-10-CM | POA: Insufficient documentation

## 2012-06-17 DIAGNOSIS — O24919 Unspecified diabetes mellitus in pregnancy, unspecified trimester: Secondary | ICD-10-CM

## 2012-06-17 DIAGNOSIS — O10919 Unspecified pre-existing hypertension complicating pregnancy, unspecified trimester: Secondary | ICD-10-CM | POA: Insufficient documentation

## 2012-06-17 DIAGNOSIS — O0990 Supervision of high risk pregnancy, unspecified, unspecified trimester: Secondary | ICD-10-CM | POA: Insufficient documentation

## 2012-06-17 DIAGNOSIS — O24419 Gestational diabetes mellitus in pregnancy, unspecified control: Secondary | ICD-10-CM

## 2012-06-17 DIAGNOSIS — Z713 Dietary counseling and surveillance: Secondary | ICD-10-CM | POA: Insufficient documentation

## 2012-06-17 DIAGNOSIS — O99810 Abnormal glucose complicating pregnancy: Secondary | ICD-10-CM

## 2012-06-17 LAB — POCT URINALYSIS DIP (DEVICE)
Bilirubin Urine: NEGATIVE
Ketones, ur: NEGATIVE mg/dL
Nitrite: NEGATIVE
Protein, ur: NEGATIVE mg/dL
pH: 6 (ref 5.0–8.0)

## 2012-06-17 NOTE — Progress Notes (Signed)
Diabetes Education:  Seen today for GDM.  She experienced GDM with her last pregnancy 4 years ago.  She reports that that baby weighted 6 lb and 11 oz.  Completed brief review of the diet and relationship to testing.  Will need to see [place] at next visit for nutrition.  Provided a True Track meter kit [id number] Exp: 2015/01/2014  1 box strips Lot: [id number] Exp:2014/08/10 and 1 box of lancets Lot: [id number] Exp: 2016/02/12.  Instructed to test fasting and 2 hours after first bite of each meal, for a total of 4 times per day.  On return demonstration of glucose testing, her glucose was 93 mg/dl at 13:08 PM.  Has not had any food or beverage other than water this morning.  Instructed to bring her meter and glucose log to each clinic appointment. Provided handout "Nutrition, Diabetes and Pregnancy, and Carbohydrate Counting."   Will f/u at future appointments.  Maggie Garey Alleva, RN, CDE

## 2012-06-17 NOTE — Progress Notes (Signed)
Watch BP-if still up, will need to increase labetalol. Failed 3 hour--will get education today and check BS.  Needs optho and Fetal Echo. Anatomy u/s is scheduled.

## 2012-06-17 NOTE — Progress Notes (Signed)
Pulse 94 Patient reports pain in RLQ

## 2012-06-17 NOTE — Progress Notes (Signed)
Patient given an appt. For Fetal Echo on 06/24/12 with Dr. Elizebeth Brooking. Patient advised of cost of $643.00 and that she needs to bring $100 to her visit. Patient states she does not have $100 and is not working. Patient advised of the importance yet advised to cancel if she is not going to keep it. Patient wants to know the exact cost of the eye exam. Rn advised patient she will call and find out and will call patient back.

## 2012-06-17 NOTE — Patient Instructions (Addendum)
Following an appropriate diet and keeping your blood sugar under control is the most important thing to do for your health and that of your unborn baby.  Please check your blood sugar 4 times daily.  Please keep accurate BS logs and bring them with you to every visit.  Please bring your meter also.  Goals for Blood sugar should be: 1. Fasting (first thing in the morning before eating) should be less than 90.   2.  2 hours after meals should be less than 120.  Please eat 3 meals and 3 snacks.  Include protein (meat, dairy-cheese, eggs, nuts) with all meals.  Be mindful that carbohydrates increase your blood sugar.  Not just sweet food (cookies, cake, donuts, fruit, juice, soda) but also bread, pasta, rice, and potatoes.  You have to limit how many carbs you are eating.  Adding exercise, as little as 30 minutes a day can decrease your blood sugar. Gestational Diabetes Mellitus Gestational diabetes mellitus (GDM) is diabetes that occurs only during pregnancy. This happens when the body cannot properly handle the glucose (sugar) that increases in the blood after eating. During pregnancy, insulin resistance (reduced sensitivity to insulin) occurs because of the release of hormones from the placenta. Usually, the pancreas of pregnant women produces enough insulin to overcome the resistance that occurs. However, in gestational diabetes, the insulin is there but it does not work effectively. If the resistance is severe enough that the pancreas does not produce enough insulin, extra glucose builds up in the blood.  WHO IS AT RISK FOR DEVELOPING GESTATIONAL DIABETES?  Women with a history of diabetes in the family.  Women over age 25.  Women who are overweight.  Women in certain ethnic groups (Hispanic, African American, Native American, Asian and Pacific Islander). WHAT CAN HAPPEN TO THE BABY? If the mother's blood glucose is too high while she is pregnant, the extra sugar will travel through the  umbilical cord to the baby. Some of the problems the baby may have are:  Large Baby - If the baby receives too much sugar, the baby will gain more weight. This may cause the baby to be too large to be born normally (vaginally) and a Cesarean section (C-section) may be needed.  Low Blood Glucose (hypoglycemia)  The baby makes extra insulin, in response to the extra sugar its gets from its mother. When the baby is born and no longer needs this extra insulin, the baby's blood glucose level may drop.  Jaundice (yellow coloring of the skin and eyes)  This is fairly common in babies. It is caused from a build-up of the chemical called bilirubin. This is rarely serious, but is seen more often in babies whose mothers had gestational diabetes. RISKS TO THE MOTHER Women who have had gestational diabetes may be at higher risk for some problems, including:  Preeclampsia or toxemia, which includes problems with high blood pressure. Blood pressure and protein levels in the urine must be checked frequently.  Infections.  Cesarean section (C-section) for delivery.  Developing Type 2 diabetes later in life. About 30-50% will develop diabetes later, especially if obese. DIAGNOSIS  The hormones that cause insulin resistance are highest at about 24-28 weeks of pregnancy. If symptoms are experienced, they are much like symptoms you would normally expect during pregnancy.  GDM is often diagnosed using a two part method: 1. After 24-28 weeks of pregnancy, the woman drinks a glucose solution and takes a blood test. If the glucose level is high, a   second test will be given. 2. Oral Glucose Tolerance Test (OGTT) which is 3 hours long  After not eating overnight, the blood glucose is checked. The woman drinks a glucose solution, and hourly blood glucose tests are taken. If the woman has risk factors for GDM, the caregiver may test earlier than 24 weeks of pregnancy. TREATMENT  Treatment of GDM is directed at keeping  the mother's blood glucose level normal, and may include:  Meal planning.  Taking insulin or other medicine to control your blood glucose level.  Exercise.  Keeping a daily record of the foods you eat.  Blood glucose monitoring and keeping a record of your blood glucose levels.  May monitor ketone levels in the urine, although this is no longer considered necessary in most pregnancies. HOME CARE INSTRUCTIONS  While you are pregnant:  Follow your caregiver's advice regarding your prenatal appointments, meal planning, exercise, medicines, vitamins, blood and other tests, and physical activities.  Keep a record of your meals, blood glucose tests, and the amount of insulin you are taking (if any). Show this to your caregiver at every prenatal visit.  If you have GDM, you may have problems with hypoglycemia (low blood glucose). You may suspect this if you become suddenly dizzy, feel shaky, and/or weak. If you think this is happening and you have a glucose meter, try to test your blood glucose level. Follow your caregiver's advice for when and how to treat your low blood glucose. Generally, the 15:15 rule is followed: Treat by consuming 15 grams of carbohydrates, wait 15 minutes, and recheck blood glucose. Examples of 15 grams of carbohydrates are:  1 cup skim or low-fat milk.   cup juice.  3-4 glucose tablets.  5-6 hard candies.  1 small box raisins.   cup regular soda pop.  Practice good hygiene, to avoid infections.  Do not smoke. SEEK MEDICAL CARE IF:   You develop abnormal vaginal discharge, with or without itching.  You become weak and tired more than expected.  You seem to sweat a lot.  You have a sudden increase in weight, 5 pounds or more in one week.  You are losing weight, 3 pounds or more in a week.  Your blood glucose level is high, and you need instructions on what to do about it. SEEK IMMEDIATE MEDICAL CARE IF:   You develop a severe headache.  You  faint or pass out.  You develop nausea and vomiting.  You become disoriented or confused.  You have a convulsion.  You develop vision problems.  You develop stomach pain.  You develop vaginal bleeding.  You develop uterine contractions.  You have leaking or a gush of fluid from the vagina. AFTER YOU HAVE THE BABY:  Go to all of your follow-up appointments, and have blood tests as advised by your caregiver.  Maintain a healthy lifestyle, to prevent diabetes in the future. This includes:  Following a healthy meal plan.  Controlling your weight.  Getting enough exercise and proper rest.  Do not smoke.  Breastfeed your baby if you can. This will lower the chance of you and your baby developing diabetes later in life. For more information about diabetes, go to the American Diabetes Association at: www.americandiabetesassociation.org. For more information about gestational diabetes, go to the American Congress of Obstetricians and Gynecologists at: www.acog.org. Document Released: 12/04/2000 Document Revised: 11/20/2011 Document Reviewed: 06/28/2009 Exit[employer] Patient Information 2013 [employer].  

## 2012-06-28 ENCOUNTER — Ambulatory Visit (HOSPITAL_COMMUNITY)
Admission: RE | Admit: 2012-06-28 | Discharge: 2012-06-28 | Disposition: A | Discharge status: Home / Self Care | Payer: Self-pay | Source: Ambulatory Visit | Attending: Obstetrics & Gynecology | Admitting: Obstetrics & Gynecology

## 2012-06-28 DIAGNOSIS — O0990 Supervision of high risk pregnancy, unspecified, unspecified trimester: Secondary | ICD-10-CM | POA: Insufficient documentation

## 2012-07-01 ENCOUNTER — Ambulatory Visit (INDEPENDENT_AMBULATORY_CARE_PROVIDER_SITE_OTHER): Payer: Self-pay | Admitting: Advanced Practice Midwife

## 2012-07-01 VITALS — BP 138/78 | Temp 97.7°F | Wt 249.6 lb

## 2012-07-01 DIAGNOSIS — O09299 Supervision of pregnancy with other poor reproductive or obstetric history, unspecified trimester: Secondary | ICD-10-CM

## 2012-07-01 DIAGNOSIS — O0990 Supervision of high risk pregnancy, unspecified, unspecified trimester: Secondary | ICD-10-CM

## 2012-07-01 DIAGNOSIS — O24919 Unspecified diabetes mellitus in pregnancy, unspecified trimester: Secondary | ICD-10-CM

## 2012-07-01 LAB — POCT URINALYSIS DIP (DEVICE)
Ketones, ur: NEGATIVE mg/dL
Protein, ur: NEGATIVE mg/dL
Specific Gravity, Urine: 1.015 (ref 1.005–1.030)
Urobilinogen, UA: 0.2 mg/dL (ref 0.0–1.0)
pH: 7 (ref 5.0–8.0)

## 2012-07-01 NOTE — Progress Notes (Signed)
Forgot log. Fasting CBGs ?100's, PC ?Marland Kitchen Discussed importance of checking CBGs, bringing log and good control of blood sugar to decrease risks of shoulder dystocia, stillbirth, etc. Repeat 138/78. Taking Labetalol as directed. Took this morning. Watch BP. PIH warnings reviewed.

## 2012-07-01 NOTE — Progress Notes (Signed)
Pulse- 101 

## 2012-07-09 ENCOUNTER — Encounter: Payer: Self-pay | Admitting: Family Medicine

## 2012-07-09 ENCOUNTER — Other Ambulatory Visit (HOSPITAL_COMMUNITY): Payer: Self-pay | Admitting: Family

## 2012-07-10 ENCOUNTER — Other Ambulatory Visit: Payer: Self-pay | Admitting: *Deleted

## 2012-07-10 MED ORDER — LABETALOL HCL 100 MG PO TABS: 200.0000 mg | ORAL_TABLET | Freq: Two times a day (BID) | ORAL | Status: DC

## 2012-07-11 ENCOUNTER — Telehealth: Payer: Self-pay | Admitting: *Deleted

## 2012-07-11 ENCOUNTER — Encounter: Payer: Self-pay | Admitting: Family Medicine

## 2012-07-11 NOTE — Telephone Encounter (Signed)
Pt sent message via My Chart that she was having some abd cramping for the past 4 days. She wanted to know if she should go to MAU. I consulted with Dr. Erin Fulling then left phone message for pt that she should go to MAU if she has vaginal bleeding or if her pain is severe and she does not feel that she can wait to be seen @ her scheduled visit on 07/15/12. Otherwise, she should take Tylenol, increase fluid intake and rest.

## 2012-07-15 ENCOUNTER — Ambulatory Visit (HOSPITAL_COMMUNITY)
Admission: RE | Admit: 2012-07-15 | Discharge: 2012-07-15 | Disposition: A | Discharge status: Home / Self Care | Payer: Self-pay | Source: Ambulatory Visit | Attending: Family Medicine | Admitting: Family Medicine

## 2012-07-15 ENCOUNTER — Ambulatory Visit (INDEPENDENT_AMBULATORY_CARE_PROVIDER_SITE_OTHER): Payer: Self-pay | Admitting: Family Medicine

## 2012-07-15 VITALS — BP 142/78 | Temp 97.8°F | Wt 251.1 lb

## 2012-07-15 DIAGNOSIS — H6690 Otitis media, unspecified, unspecified ear: Secondary | ICD-10-CM

## 2012-07-15 DIAGNOSIS — O99810 Abnormal glucose complicating pregnancy: Secondary | ICD-10-CM | POA: Insufficient documentation

## 2012-07-15 DIAGNOSIS — O10019 Pre-existing essential hypertension complicating pregnancy, unspecified trimester: Secondary | ICD-10-CM | POA: Insufficient documentation

## 2012-07-15 DIAGNOSIS — Z8751 Personal history of pre-term labor: Secondary | ICD-10-CM | POA: Insufficient documentation

## 2012-07-15 LAB — POCT URINALYSIS DIP (DEVICE)
Bilirubin Urine: NEGATIVE
Glucose, UA: NEGATIVE mg/dL
Ketones, ur: NEGATIVE mg/dL
Leukocytes, UA: NEGATIVE
Nitrite: NEGATIVE

## 2012-07-15 MED ORDER — AMOXICILLIN 500 MG PO CAPS: 500.0000 mg | ORAL_CAPSULE | Freq: Two times a day (BID) | ORAL | Status: DC

## 2012-07-15 NOTE — Progress Notes (Signed)
Patient sent to U/S department to be worked into the scheduled today.

## 2012-07-15 NOTE — Progress Notes (Signed)
HA, ear pain, sinus pressure. Erythema R TM. Amoxicillin.  Chest somewhat tight, chest pressure when lying down. Irregular breathing at night per pt's husband. RRR, no murmur. CTAB. Pt is morbidly obese. Recommend elevating head with additional pillow, sleeping tilted to left side. Fell on stomach this morning. Having cramping and low back pain -- cervix closed visually and on SVE. Send for ultrasound for abruption. CHTN:  On labetalol. BP controlled. Does endorse HA and RUQ pain. But no proteinuria today. No tenderness in RUQ on exam. HA may be due to sinus/ear infection.

## 2012-07-15 NOTE — Progress Notes (Signed)
P = 102 Pressure in lower abdomen, cramping in lower abdomen Occasional "brown" d/c with wiping.  HA x 3 days, not relieved by tylenol. Blurred vision with HA. + RUQ pain. - LE/UE edema + edema in face

## 2012-07-15 NOTE — Patient Instructions (Addendum)
Call if you have any vaginal bleeding or increased cramping/contractions.  Hypertension During Pregnancy Hypertension is also called high blood pressure. It can occur at any time in life and during pregnancy. When you have hypertension, there is extra pressure inside your blood vessels that carry blood from the heart to the rest of your body (arteries). Hypertension during pregnancy can cause problems for you and your baby. Your baby might not weigh as much as it should at birth or might be born early (premature). Very bad cases of hypertension during pregnancy can be life-threatening.  There are different types of hypertension during pregnancy.   Chronic hypertension. This happens when a woman has hypertension before pregnancy and it continues during pregnancy.  Gestational hypertension. This is when hypertension develops during pregnancy.  Preeclampsia or toxemia of pregnancy. This is a very serious type of hypertension that develops only during pregnancy. It is a disease that affects the whole body (systemic) and can be very dangerous for both mother and baby.  Gestational hypertension and preeclampsia usually go away after your baby is born. Blood pressure generally stabilizes within 6 weeks. Women who have hypertension during pregnancy have a greater chance of developing hypertension later in life or with future pregnancies. UNDERSTANDING BLOOD PRESSURE Blood pressure moves blood in your body. Sometimes, the force that moves the blood becomes too strong.  A blood pressure reading is given in 2 numbers and looks like a fraction.  The top number is called the systolic pressure. When your heart beats, it forces more blood to flow through the arteries. Pressure inside the arteries goes up.  The bottom number is the diastolic pressure. Pressure goes down between beats. That is when the heart is resting.  You may have hypertension if:  Your systolic blood pressure is above 140.  Your  diastolic pressure is above 90. RISK FACTORS Some factors make you more likely to develop hypertension during pregnancy. Risk factors include:  Having hypertension before pregnancy.  Having hypertension during a previous pregnancy.  Being overweight.  Being older than 40.  Being pregnant with more than 1 baby (multiples).  Having diabetes or kidney problems. SYMPTOMS Chronic and gestational hypertension may not cause symptoms. Preeclampsia has symptoms, which may include:  Increased protein in your urine. Your caregiver will check for this at every prenatal visit.  Swelling of your hands and face.  Rapid weight gain.  Headaches.  Visual changes.  Being bothered by light.  Abdominal pain, especially in the right upper area.  Chest pain.  Shortness of breath.  Increased reflexes.  Seizures. Seizures occur with a more severe form of preeclampsia, called eclampsia. DIAGNOSIS   You may be diagnosed with hypertension during pregnancy during a regular prenatal exam. At each visit, tests may include:  Blood pressure checks.  A urine test to check for protein in your urine.  The type of hypertension you are diagnosed with depends on when you developed it. It also depends on your specific blood pressure reading.  Developing hypertension before 20 weeks of pregnancy is consistent with chronic hypertension.  Developing hypertension after 20 weeks of pregnancy is consistent with gestational hypertension.  Hypertension with increased urinary protein is diagnosed as preeclampsia.  Blood pressure measurements that stay above 160 systolic or 110 diastolic are a sign of severe preeclampsia. TREATMENT Treatment for hypertension during pregnancy varies. Treatment depends on the type of hypertension and how serious it is.  If you take medicine for chronic hypertension, you may need to switch medicines.  Drugs called ACE inhibitors should not be taken during  pregnancy.  Low-dose aspirin may be suggested for women who have risk factors for preeclampsia.  If you have gestational hypertension, you may need to take a blood pressure medicine that is safe during pregnancy. Your caregiver will recommend the appropriate medicine.  If you have severe preeclampsia, you may need to be in the hospital. Caregivers will watch you and the baby very closely. You also may need to take medicine (magnesium sulfate) to prevent seizures and lower blood pressure.  Sometimes an early delivery is needed. This may be the case if the condition worsens. It would be done to protect you and the baby. The only cure for preeclampsia is delivery. HOME CARE INSTRUCTIONS  Schedule and keep all of your regular prenatal care.  Follow your caregiver's instructions for taking medicines. Tell your caregiver about all medicines you take. This includes over-the-counter medicines.  Eat as little salt as possible.  Get regular exercise.  Do not drink alcohol.  Do not use tobacco products.  Do not drink products with caffeine.  Lie on your left side when resting.  Tell your doctor if you have any preeclampsia symptoms. SEEK IMMEDIATE MEDICAL CARE IF:  You have severe abdominal pain.  You have sudden swelling in the hands, ankles, or face.  You gain 4 pounds (1.8 kg) or more in 1 week.  You vomit repeatedly.  You have vaginal bleeding.  You do not feel the baby moving as much.  You have a headache.  You have blurred or double vision.  You have muscle twitching or spasms.  You have shortness of breath.  You have blue fingernails and lips.  You have blood in your urine. MAKE SURE YOU:  Understand these instructions.  Will watch your condition.  Will get help right away if you are not doing well. Document Released: 05/16/2011 Document Revised: 11/20/2011 Document Reviewed: 05/16/2011 [facility] Patient Information 2013 [place], [place].  Injuries In  Pregnancy Trauma is the most common cause of injury and death in pregnant women. The most common cause of death to the fetus is injury and death of the pregnant mother.  Minor falls and minor automobile accidents do not usually harm the fetus. The fetus is protected in the womb by a sac filled with fluid. The fetus can be harmed if there is direct trauma to your abdomen and pelvis. Direct trauma to the uterus and placenta can affect the blood supply to the fetus. Major trauma causing significant bleeding and shock to the mother can also compromise and jeopardize the fetal blood supply. It is important to know your blood type and the father's blood type in case you develop vaginal bleeding. If you are RH negative and have sustained serious trauma or develop vaginal bleeding, you will need to have medicine (RhoGAM [Rh immune globulin]) to avoid Rh problems in future pregnancies. CAUSES  Falls are more common in the second and third trimester of the pregnancy. Factors that increase your risk of falling include:  Increase in weight.  The change of your center of gravity.  Tripping over an object that cannot be seen.  Automobile accidents. It is important to wear a seat belt and always practice safe driving.  Domestic violence or assault. Dial your local emergency services (911 in the [place]). Spousal abuse can be a significant cause of trauma during pregnancy.  Burns (Metallurgist). Avoid fires, starting fires, lifting heavy pots of boiling or hot liquids, and fixing electrical  problems. The most common causes of death to the pregnant woman include:  Injuries that cause severe bleeding, shock and loss of blood flow to the mother's major organs.  Head and neck injuries that result in severe brain or spinal damage.  Chest trauma that can cause direct injury to the heart and lungs or any injury that effects the area enclosed by the ribs (thorax). Trauma to this area can result in  cardio-respiratory arrest. Symptoms and treatment will depend on the type of injury. HOME CARE INSTRUCTIONS   Call your caregiver if you are in a car accident, even if you think you and the baby are not hurt. Your caregiver may want you to have a precautionary evaluation.  Do not take aspirin. It can worsen bleeding.  You may apply cold packs 3 to 4 times a day to the injury with your caregiver's permission.  After 24 hours apply warm compresses to the injured site with your caregiver's permission.  Call your caregiver if you are having increasing pain in any part of your body that is not remedied by your instructed home care.  In a severe injury, try to have someone be with you and help you until you are able to take care of yourself.  Do not wear high heel shoes while pregnant.  Remove slippery rugs and loose objects on the floor. SEEK IMMEDIATE MEDICAL CARE IF:   You have been assaulted (domestic or otherwise).  You have been in a car accident.  You develop vaginal bleeding.  You develop fluid leaking from the vagina.  You develop uterine contractions (pelvic cramping or pain).  You develop neck stiffness or pain.  You become weak or faint, or have uncontrolled vomiting after trauma.  You had a serious burn. This includes burns to the face, neck, hands or genitals, or burns greater than the size of your palm anywhere else.  You develop a headache or vision problems after a fall or from other trauma.  You do not feel the baby moving or the baby is not moving as much as before. Document Released: 10/05/2004 Document Revised: 11/20/2011 Document Reviewed: 01/10/2010 [facility] Patient Information 2013 [place], [place].   Tension Headache A tension headache is a feeling of pain, pressure, or aching often felt over the front and sides of the head. The pain can be dull or can feel tight (constricting). It is the most common type of headache. Tension headaches are not normally  associated with nausea or vomiting and do not get worse with physical activity. Tension headaches can last 30 minutes to several days.  CAUSES  The exact cause is not known, but it may be caused by chemicals and hormones in the brain that lead to pain. Tension headaches often begin after stress, anxiety, or depression. Other triggers may include:  Alcohol.  Caffeine (too much or withdrawal).  Respiratory infections (colds, flu, sinus infections).  Dental problems or teeth clenching.  Fatigue.  Holding your head and neck in one position too long while using a computer. SYMPTOMS   Pressure around the head.   Dull, aching head pain.   Pain felt over the front and sides of the head.   Tenderness in the muscles of the head, neck, and shoulders. DIAGNOSIS  A tension headache is often diagnosed based on:   Symptoms.   Physical examination.   A CT scan or MRI of your head. These tests may be ordered if symptoms are severe or unusual. TREATMENT  Medicines may be given  to help relieve symptoms.  HOME CARE INSTRUCTIONS   Only take over-the-counter or prescription medicines for pain or discomfort as directed by your caregiver.   Lie down in a dark, quiet room when you have a headache.   Keep a journal to find out what may be triggering your headaches. For example, write down:  What you eat and drink.  How much sleep you get.  Any change to your diet or medicines.  Try massage or other relaxation techniques.   Ice packs or heat applied to the head and neck can be used. Use these 3 to 4 times per day for 15 to 20 minutes each time, or as needed.   Limit stress.   Sit up straight, and do not tense your muscles.   Quit smoking if you smoke.  Limit alcohol use.  Decrease the amount of caffeine you drink, or stop drinking caffeine.  Eat and exercise regularly.  Get 7 to 9 hours of sleep, or as recommended by your caregiver.  Avoid excessive use of pain  medicine as recurrent headaches can occur.  SEEK MEDICAL CARE IF:   You have problems with the medicines you were prescribed.  Your medicines do not work.  You have a change from the usual headache.  You have nausea or vomiting. SEEK IMMEDIATE MEDICAL CARE IF:   Your headache becomes severe.  You have a fever.  You have a stiff neck.  You have loss of vision.  You have muscular weakness or loss of muscle control.  You lose your balance or have trouble walking.  You feel faint or pass out.  You have severe symptoms that are different from your first symptoms. MAKE SURE YOU:   Understand these instructions.  Will watch your condition.  Will get help right away if you are not doing well or get worse. Document Released: 08/28/2005 Document Revised: 11/20/2011 Document Reviewed: 08/18/2011 [facility] Patient Information 2013 [place], [place].

## 2012-07-21 ENCOUNTER — Encounter: Payer: Self-pay | Admitting: Family Medicine

## 2012-07-22 ENCOUNTER — Encounter: Payer: Self-pay | Admitting: *Deleted

## 2012-07-22 ENCOUNTER — Telehealth: Payer: Self-pay | Admitting: *Deleted

## 2012-07-22 DIAGNOSIS — N898 Other specified noninflammatory disorders of vagina: Secondary | ICD-10-CM

## 2012-07-22 MED ORDER — FLUCONAZOLE 150 MG PO TABS: 150.0000 mg | ORAL_TABLET | Freq: Once | ORAL | Status: DC

## 2012-07-22 NOTE — Telephone Encounter (Signed)
Patient has been going to [facility] for prenatal care and has an appt on 07/29/12 with Dr. Penne Lash.  Patient may need to contact them for advice.  Returned call to patient and left message to call our office back.   Gaylene Brooks, RN

## 2012-07-22 NOTE — Telephone Encounter (Signed)
Latasha Simmons called and states she is pregnant [redacted] weeks and has a vaginal itch x 4 days and it is getting worse- states she sent a message but hasn't heard anything, and requests a call

## 2012-07-22 NOTE — Telephone Encounter (Signed)
Garnette came to window and states someone sent her a message to come to office. Showed nurse a  Message on her phone from family medicine. Explained to patient they have transferred her care to [place] for during pregnancy and we can treat her for this. Patient complains of vaginal itching x 4 days with thick white discharge and itching is getting worse and is sore in perineal area. States having cramping she was having at last visit and states it is no worse or better but same.  Informed her I can send in a prescription for diflucan x1 and if that doesn't help, discharge is worse, cramping worse or has contractions or bleeding to come to mau at any time. Patient voices understanding.

## 2012-07-25 ENCOUNTER — Encounter (HOSPITAL_COMMUNITY): Payer: Self-pay | Admitting: *Deleted

## 2012-07-25 ENCOUNTER — Inpatient Hospital Stay (HOSPITAL_COMMUNITY)
Admission: AD | Admit: 2012-07-25 | Discharge: 2012-07-25 | Disposition: A | Discharge status: Home / Self Care | Payer: Self-pay | Source: Ambulatory Visit | Attending: Obstetrics & Gynecology | Admitting: Obstetrics & Gynecology

## 2012-07-25 DIAGNOSIS — N949 Unspecified condition associated with female genital organs and menstrual cycle: Secondary | ICD-10-CM | POA: Insufficient documentation

## 2012-07-25 DIAGNOSIS — L293 Anogenital pruritus, unspecified: Secondary | ICD-10-CM | POA: Insufficient documentation

## 2012-07-25 DIAGNOSIS — B9689 Other specified bacterial agents as the cause of diseases classified elsewhere: Secondary | ICD-10-CM | POA: Insufficient documentation

## 2012-07-25 DIAGNOSIS — A499 Bacterial infection, unspecified: Secondary | ICD-10-CM | POA: Insufficient documentation

## 2012-07-25 DIAGNOSIS — O2390 Unspecified genitourinary tract infection in pregnancy, unspecified trimester: Secondary | ICD-10-CM | POA: Insufficient documentation

## 2012-07-25 DIAGNOSIS — N760 Acute vaginitis: Secondary | ICD-10-CM | POA: Insufficient documentation

## 2012-07-25 HISTORY — DX: Type 2 diabetes mellitus without complications: E11.9

## 2012-07-25 LAB — WET PREP, GENITAL: Yeast Wet Prep HPF POC: NONE SEEN

## 2012-07-25 MED ORDER — LIDOCAINE HCL 2 % EX GEL: CUTANEOUS | Status: DC | PRN

## 2012-07-25 MED ORDER — LIDOCAINE HCL 2 % EX GEL
Freq: Once | CUTANEOUS | Status: AC
  Administered 2012-07-25: 10 via TOPICAL
  Filled 2012-07-25: qty 20

## 2012-07-25 MED ORDER — METRONIDAZOLE 500 MG PO TABS: 500.0000 mg | ORAL_TABLET | Freq: Two times a day (BID) | ORAL | Status: DC

## 2012-07-25 NOTE — MAU Note (Signed)
Vaginal itching for the last 6 days. Took Diflucan 2 days ago but the discharge and itching has gotten worse.

## 2012-07-25 NOTE — MAU Provider Note (Signed)
History     CSN: [id number]  Arrival date and time: 07/25/12 0130   First Provider Initiated Contact with Patient 07/25/12 0237      Chief Complaint  Patient presents with  . Vaginal Itching   HPI Ms Furlan is a [id number] at 23.2wks who presents with a 1wk hx of vag itching. States it began in the clitoral area but now has moved posteriorly towards her buttocks with generalized perineal itching. Is having a thin white d/c. Also noticed 2 small bumps on her vagina. Was given Diflucan by the [facility] on 11/11 and got no relief from this. UA on 11/4 was neg and denies primary dysuria but does have some discomfort when the urine comes into contact with the irritated tissue.  OB History    Grav Para Term Preterm Abortions TAB SAB Ect Mult Living   4 3 1 2      3       Past Medical History  Diagnosis Date  . Hypertension   . Anemia   . Heart murmur   . Morbid obesity   . Fibroid   . Diabetes mellitus without complication     Past Surgical History  Procedure Date  . No past surgeries     Family History  Problem Relation Age of Onset  . Other Neg Hx     History  Substance Use Topics  . Smoking status: Never Smoker   . Smokeless tobacco: Never Used  . Alcohol Use: No    Allergies:  Allergies  Allergen Reactions  . Kiwi Extract Nausea Only  . Latex Itching    Prescriptions prior to admission  Medication Sig Dispense Refill  . acetaminophen (TYLENOL) 500 MG tablet Take 500 mg by mouth every 6 (six) hours as needed.      Marland Kitchen amoxicillin (AMOXIL) 500 MG capsule Take 1 capsule (500 mg total) by mouth 2 (two) times daily.  14 capsule  0  . labetalol (NORMODYNE) 100 MG tablet Take 2 tablets (200 mg total) by mouth 2 (two) times daily.  60 tablet  2  . omeprazole (PRILOSEC) 20 MG capsule Take 20 mg by mouth daily as needed. For nausea or heartburn      . Prenatal Vit-Fe Fumarate-FA (PRENATAL VITAMINS PLUS) 27-1 MG TABS Take 1 tablet by mouth daily.  30 tablet  11  .  [DISCONTINUED] fluconazole (DIFLUCAN) 150 MG tablet Take 1 tablet (150 mg total) by mouth once.  1 tablet  0    ROS Physical Exam   Blood pressure 142/80, pulse 90, temperature 97.7 F (36.5 C), temperature source Oral, resp. rate 20, height 5\' 4"  (1.626 m), weight 117.482 kg (259 lb), last menstrual period 02/05/2012, SpO2 99.00%.  Physical Exam  Constitutional: She is oriented to person, place, and time. She appears well-developed.  HENT:  Head: Normocephalic.  Cardiovascular: Normal rate.   Respiratory: Effort normal.  GI:       FHR tracing at 145, appropriate for GA  Genitourinary:       External vagina nl; slight erythema but no rash; at introitus are 2 small red bumps the size of skin tags; no ulceration or discharge from them- cultured for HSV;  Mod amount thin white vag d/c  Musculoskeletal: Normal range of motion.  Neurological: She is alert and oriented to person, place, and time.  Skin: Skin is warm and dry.  Psychiatric: She has a normal mood and affect. Her behavior is normal. Thought content normal.  Wet prep: few clue, few  WBC, neg yeast, neg trich  MAU Course  Procedures  MDM HSV culture obtained but not likely to be positive  Assessment and Plan  IUP at 23.2 wks Vag irritation BV  D/C home with Rx Flagyl and tube of lido jelly 2% for discomfort F/U as sched at [facility] on 07/29/12  SHAW, [facility] 07/25/2012, 2:57 AM

## 2012-07-26 LAB — GC/CHLAMYDIA PROBE AMP, GENITAL: GC Probe Amp, Genital: NEGATIVE

## 2012-07-26 LAB — HERPES SIMPLEX VIRUS CULTURE

## 2012-07-29 ENCOUNTER — Encounter: Payer: Self-pay | Admitting: Family Medicine

## 2012-07-29 ENCOUNTER — Ambulatory Visit (INDEPENDENT_AMBULATORY_CARE_PROVIDER_SITE_OTHER): Payer: Self-pay | Admitting: Obstetrics & Gynecology

## 2012-07-29 VITALS — BP 138/86 | Temp 97.9°F | Wt 255.0 lb

## 2012-07-29 DIAGNOSIS — IMO0001 Reserved for inherently not codable concepts without codable children: Secondary | ICD-10-CM | POA: Insufficient documentation

## 2012-07-29 DIAGNOSIS — IMO0002 Reserved for concepts with insufficient information to code with codable children: Secondary | ICD-10-CM

## 2012-07-29 LAB — POCT URINALYSIS DIP (DEVICE)
Bilirubin Urine: NEGATIVE
Ketones, ur: NEGATIVE mg/dL
Specific Gravity, Urine: 1.015 (ref 1.005–1.030)
pH: 7.5 (ref 5.0–8.0)

## 2012-07-29 NOTE — Progress Notes (Signed)
Pt hasn't eaten since yesterday (>12 hours).  Pt will need to met with Nutritionist to review proper eating schedule.  Pt also has not been checking CBGs.  She thought that she didn't have the diagnosis anymore.  CBg today =

## 2012-07-29 NOTE — Progress Notes (Signed)
Pulse 97. Vaginal d/c as thin white with itch; no odor.

## 2012-07-29 NOTE — Progress Notes (Signed)
Nutrition note: consult to review GDM diet Pt has h/o obesity & GDM. Pt has gained 4# @ [redacted]w[redacted]d, which is slightly < expected. Pt stopped checking BS because she thought she didn't need to since no one asked for her meter log at the last visit. Pt reports having no appetite so only eats 1 meal & 1-2 snacks/d. Pt reports drinking water, milk, & juice daily. Pt is taking PNV. Pt reports no N&V but heartburn occ. Reviewed GDM diet & discussed how she will need to continue checking BS & following GDM diet through the rest of her pregnancy. Provided handout with snack ideas & encouraged pt to eat 5-6x/d & include a CHO & protein source each time. Pt agrees to try to eat more frequently & check BS 4x/d. F/u as needed Blondell Reveal, MS, RD, LDN

## 2012-08-02 ENCOUNTER — Encounter: Payer: Self-pay | Admitting: *Deleted

## 2012-08-12 ENCOUNTER — Ambulatory Visit (INDEPENDENT_AMBULATORY_CARE_PROVIDER_SITE_OTHER): Payer: Self-pay | Admitting: Obstetrics & Gynecology

## 2012-08-12 ENCOUNTER — Other Ambulatory Visit (HOSPITAL_COMMUNITY)
Admission: RE | Admit: 2012-08-12 | Discharge: 2012-08-12 | Disposition: A | Discharge status: Home / Self Care | Payer: Self-pay | Source: Ambulatory Visit | Attending: Obstetrics & Gynecology | Admitting: Obstetrics & Gynecology

## 2012-08-12 ENCOUNTER — Encounter: Payer: Self-pay | Admitting: Obstetrics & Gynecology

## 2012-08-12 VITALS — BP 134/85 | Temp 97.4°F | Wt 254.2 lb

## 2012-08-12 DIAGNOSIS — Z113 Encounter for screening for infections with a predominantly sexual mode of transmission: Secondary | ICD-10-CM | POA: Insufficient documentation

## 2012-08-12 DIAGNOSIS — O09299 Supervision of pregnancy with other poor reproductive or obstetric history, unspecified trimester: Secondary | ICD-10-CM

## 2012-08-12 DIAGNOSIS — O10919 Unspecified pre-existing hypertension complicating pregnancy, unspecified trimester: Secondary | ICD-10-CM

## 2012-08-12 DIAGNOSIS — O09899 Supervision of other high risk pregnancies, unspecified trimester: Secondary | ICD-10-CM | POA: Insufficient documentation

## 2012-08-12 DIAGNOSIS — O09219 Supervision of pregnancy with history of pre-term labor, unspecified trimester: Secondary | ICD-10-CM

## 2012-08-12 DIAGNOSIS — O9989 Other specified diseases and conditions complicating pregnancy, childbirth and the puerperium: Secondary | ICD-10-CM

## 2012-08-12 DIAGNOSIS — O0990 Supervision of high risk pregnancy, unspecified, unspecified trimester: Secondary | ICD-10-CM

## 2012-08-12 DIAGNOSIS — N898 Other specified noninflammatory disorders of vagina: Secondary | ICD-10-CM

## 2012-08-12 DIAGNOSIS — I10 Essential (primary) hypertension: Secondary | ICD-10-CM

## 2012-08-12 DIAGNOSIS — N760 Acute vaginitis: Secondary | ICD-10-CM | POA: Insufficient documentation

## 2012-08-12 DIAGNOSIS — O10019 Pre-existing essential hypertension complicating pregnancy, unspecified trimester: Secondary | ICD-10-CM

## 2012-08-12 DIAGNOSIS — O24919 Unspecified diabetes mellitus in pregnancy, unspecified trimester: Secondary | ICD-10-CM

## 2012-08-12 DIAGNOSIS — IMO0001 Reserved for inherently not codable concepts without codable children: Secondary | ICD-10-CM

## 2012-08-12 HISTORY — DX: Supervision of other high risk pregnancies, unspecified trimester: O09.899

## 2012-08-12 LAB — POCT URINALYSIS DIP (DEVICE)
Bilirubin Urine: NEGATIVE
Glucose, UA: NEGATIVE mg/dL
Leukocytes, UA: NEGATIVE
Nitrite: NEGATIVE

## 2012-08-12 MED ORDER — GLYBURIDE 2.5 MG PO TABS: 2.5000 mg | ORAL_TABLET | Freq: Every day | ORAL | Status: DC

## 2012-08-12 NOTE — Progress Notes (Signed)
U/S scheduled 08/19/12 at 1045 am.

## 2012-08-12 NOTE — Progress Notes (Signed)
Pulse: 97 Needs a refill on her labetalol

## 2012-08-12 NOTE — Progress Notes (Signed)
Routine visit. Good FM. No OB problems. I will order an u/s for S greater D and f/u growth. Because her fbs are almost all greater than 90-110, I will have her start on glyburide 2.5 mg qhs. She also complains of vaginal itching, not relieved with Flagyl. Wet prep sent.

## 2012-08-19 ENCOUNTER — Ambulatory Visit (HOSPITAL_COMMUNITY): Payer: Self-pay

## 2012-08-19 ENCOUNTER — Ambulatory Visit (HOSPITAL_COMMUNITY)
Admission: RE | Admit: 2012-08-19 | Discharge: 2012-08-19 | Disposition: A | Discharge status: Home / Self Care | Payer: Self-pay | Source: Ambulatory Visit | Attending: Obstetrics & Gynecology | Admitting: Obstetrics & Gynecology

## 2012-08-19 ENCOUNTER — Ambulatory Visit (INDEPENDENT_AMBULATORY_CARE_PROVIDER_SITE_OTHER): Payer: Self-pay | Admitting: Family Medicine

## 2012-08-19 ENCOUNTER — Encounter: Payer: Self-pay | Admitting: Family Medicine

## 2012-08-19 VITALS — BP 141/88 | Wt 255.9 lb

## 2012-08-19 DIAGNOSIS — Z8751 Personal history of pre-term labor: Secondary | ICD-10-CM | POA: Insufficient documentation

## 2012-08-19 DIAGNOSIS — O10019 Pre-existing essential hypertension complicating pregnancy, unspecified trimester: Secondary | ICD-10-CM | POA: Insufficient documentation

## 2012-08-19 DIAGNOSIS — O99810 Abnormal glucose complicating pregnancy: Secondary | ICD-10-CM | POA: Insufficient documentation

## 2012-08-19 DIAGNOSIS — O09219 Supervision of pregnancy with history of pre-term labor, unspecified trimester: Secondary | ICD-10-CM

## 2012-08-19 DIAGNOSIS — O24919 Unspecified diabetes mellitus in pregnancy, unspecified trimester: Secondary | ICD-10-CM

## 2012-08-19 DIAGNOSIS — Z23 Encounter for immunization: Secondary | ICD-10-CM

## 2012-08-19 LAB — POCT URINALYSIS DIP (DEVICE)
Bilirubin Urine: NEGATIVE
Ketones, ur: 15 mg/dL — AB
Leukocytes, UA: NEGATIVE

## 2012-08-19 LAB — CBC
Hemoglobin: 10.7 g/dL — ABNORMAL LOW (ref 12.0–15.0)
RBC: 4.58 MIL/uL (ref 3.87–5.11)

## 2012-08-19 LAB — HIV ANTIBODY (ROUTINE TESTING W REFLEX): HIV: NONREACTIVE

## 2012-08-19 MED ORDER — TETANUS-DIPHTH-ACELL PERTUSSIS 5-2.5-18.5 LF-MCG/0.5 IM SUSP
0.5000 mL | Freq: Once | INTRAMUSCULAR | Status: AC
  Administered 2012-08-19: 0.5 mL via INTRAMUSCULAR

## 2012-08-19 MED ORDER — GLYBURIDE 2.5 MG PO TABS: 1.2500 mg | ORAL_TABLET | Freq: Two times a day (BID) | ORAL | Status: DC

## 2012-08-19 NOTE — Progress Notes (Signed)
P=93, C/o headache x 2 days, states it wakes her up when she is sleeping, c/o blurry vision, c/o pelvic pain x 2 days

## 2012-08-19 NOTE — Progress Notes (Signed)
C/o ear pain.  Started Glyburide 2.5 mg at hs last visit, then feels hot and dizzy in night.  FBS are now 100-110.  2 hour pp are still mildly elevated.  Will split dose and give 1.25 mg bid.

## 2012-08-19 NOTE — Patient Instructions (Addendum)
Change you medication!  You should now take 1/2 tablet of Glyburide or 1.25 mg twice daily! Try Allegra, Claritin or Zyrtec for your allergies and ear drainage. Pregnancy - Second Trimester The second trimester of pregnancy (3 to 6 months) is a period of rapid growth for you and your baby. At the end of the sixth month, your baby is about 9 inches long and weighs 1 1/2 pounds. You will begin to feel the baby move between 18 and 20 weeks of the pregnancy. This is called quickening. Weight gain is faster. A clear fluid (colostrum) may leak out of your breasts. You may feel small contractions of the womb (uterus). This is known as false labor or Braxton-Hicks contractions. This is like a practice for labor when the baby is ready to be born. Usually, the problems with morning sickness have usually passed by the end of your first trimester. Some women develop small dark blotches (called cholasma, mask of pregnancy) on their face that usually goes away after the baby is born. Exposure to the sun makes the blotches worse. Acne may also develop in some pregnant women and pregnant women who have acne, may find that it goes away. PRENATAL EXAMS  Blood work may continue to be done during prenatal exams. These tests are done to check on your health and the probable health of your baby. Blood work is used to follow your blood levels (hemoglobin). Anemia (low hemoglobin) is common during pregnancy. Iron and vitamins are given to help prevent this. You will also be checked for diabetes between 24 and 28 weeks of the pregnancy. Some of the previous blood tests may be repeated.  The size of the uterus is measured during each visit. This is to make sure that the baby is continuing to grow properly according to the dates of the pregnancy.  Your blood pressure is checked every prenatal visit. This is to make sure you are not getting toxemia.  Your urine is checked to make sure you do not have an infection, diabetes or  protein in the urine.  Your weight is checked often to make sure gains are happening at the suggested rate. This is to ensure that both you and your baby are growing normally.  Sometimes, an ultrasound is performed to confirm the proper growth and development of the baby. This is a test which bounces harmless sound waves off the baby so your caregiver can more accurately determine due dates. Sometimes, a specialized test is done on the amniotic fluid surrounding the baby. This test is called an amniocentesis. The amniotic fluid is obtained by sticking a needle into the belly (abdomen). This is done to check the chromosomes in instances where there is a concern about possible genetic problems with the baby. It is also sometimes done near the end of pregnancy if an early delivery is required. In this case, it is done to help make sure the baby's lungs are mature enough for the baby to live outside of the womb. CHANGES OCCURING IN THE SECOND TRIMESTER OF PREGNANCY Your body goes through many changes during pregnancy. They vary from person to person. Talk to your caregiver about changes you notice that you are concerned about.  During the second trimester, you will likely have an increase in your appetite. It is normal to have cravings for certain foods. This varies from person to person and pregnancy to pregnancy.  Your lower abdomen will begin to bulge.  You may have to urinate more often because  the uterus and baby are pressing on your bladder. It is also common to get more bladder infections during pregnancy (pain with urination). You can help this by drinking lots of fluids and emptying your bladder before and after intercourse.  You may begin to get stretch marks on your hips, abdomen, and breasts. These are normal changes in the body during pregnancy. There are no exercises or medications to take that prevent this change.  You may begin to develop swollen and bulging veins (varicose veins) in  your legs. Wearing support hose, elevating your feet for 15 minutes, 3 to 4 times a day and limiting salt in your diet helps lessen the problem.  Heartburn may develop as the uterus grows and pushes up against the stomach. Antacids recommended by your caregiver helps with this problem. Also, eating smaller meals 4 to 5 times a day helps.  Constipation can be treated with a stool softener or adding bulk to your diet. Drinking lots of fluids, vegetables, fruits, and whole grains are helpful.  Exercising is also helpful. If you have been very active up until your pregnancy, most of these activities can be continued during your pregnancy. If you have been less active, it is helpful to start an exercise program such as walking.  Hemorrhoids (varicose veins in the rectum) may develop at the end of the second trimester. Warm sitz baths and hemorrhoid cream recommended by your caregiver helps hemorrhoid problems.  Backaches may develop during this time of your pregnancy. Avoid heavy lifting, wear low heal shoes and practice good posture to help with backache problems.  Some pregnant women develop tingling and numbness of their hand and fingers because of swelling and tightening of ligaments in the wrist (carpel tunnel syndrome). This goes away after the baby is born.  As your breasts enlarge, you may have to get a bigger bra. Get a comfortable, cotton, support bra. Do not get a nursing bra until the last month of the pregnancy if you will be nursing the baby.  You may get a dark line from your belly button to the pubic area called the linea nigra.  You may develop rosy cheeks because of increase blood flow to the face.  You may develop spider looking lines of the face, neck, arms and chest. These go away after the baby is born. HOME CARE INSTRUCTIONS   It is extremely important to avoid all smoking, herbs, alcohol, and unprescribed drugs during your pregnancy. These chemicals affect the formation and  growth of the baby. Avoid these chemicals throughout the pregnancy to ensure the delivery of a healthy infant.  Most of your home care instructions are the same as suggested for the first trimester of your pregnancy. Keep your caregiver's appointments. Follow your caregiver's instructions regarding medication use, exercise and diet.  During pregnancy, you are providing food for you and your baby. Continue to eat regular, well-balanced meals. Choose foods such as meat, fish, milk and other low fat dairy products, vegetables, fruits, and whole-grain breads and cereals. Your caregiver will tell you of the ideal weight gain.  A physical sexual relationship may be continued up until near the end of pregnancy if there are no other problems. Problems could include early (premature) leaking of amniotic fluid from the membranes, vaginal bleeding, abdominal pain, or other medical or pregnancy problems.  Exercise regularly if there are no restrictions. Check with your caregiver if you are unsure of the safety of some of your exercises. The greatest weight gain will  occur in the last 2 trimesters of pregnancy. Exercise will help you:  Control your weight.  Get you in shape for labor and delivery.  Lose weight after you have the baby.  Wear a good support or jogging bra for breast tenderness during pregnancy. This may help if worn during sleep. Pads or tissues may be used in the bra if you are leaking colostrum.  Do not use hot tubs, steam rooms or saunas throughout the pregnancy.  Wear your seat belt at all times when driving. This protects you and your baby if you are in an accident.  Avoid raw meat, uncooked cheese, cat litter boxes and soil used by cats. These carry germs that can cause birth defects in the baby.  The second trimester is also a good time to visit your dentist for your dental health if this has not been done yet. Getting your teeth cleaned is OK. Use a soft toothbrush. Brush gently  during pregnancy.  It is easier to loose urine during pregnancy. Tightening up and strengthening the pelvic muscles will help with this problem. Practice stopping your urination while you are going to the bathroom. These are the same muscles you need to strengthen. It is also the muscles you would use as if you were trying to stop from passing gas. You can practice tightening these muscles up 10 times a set and repeating this about 3 times per day. Once you know what muscles to tighten up, do not perform these exercises during urination. It is more likely to contribute to an infection by backing up the urine.  Ask for help if you have financial, counseling or nutritional needs during pregnancy. Your caregiver will be able to offer counseling for these needs as well as refer you for other special needs.  Your skin may become oily. If so, wash your face with mild soap, use non-greasy moisturizer and oil or cream based makeup. MEDICATIONS AND DRUG USE IN PREGNANCY  Take prenatal vitamins as directed. The vitamin should contain 1 milligram of folic acid. Keep all vitamins out of reach of children. Only a couple vitamins or tablets containing iron may be fatal to a baby or young child when ingested.  Avoid use of all medications, including herbs, over-the-counter medications, not prescribed or suggested by your caregiver. Only take over-the-counter or prescription medicines for pain, discomfort, or fever as directed by your caregiver. Do not use aspirin.  Let your caregiver also know about herbs you may be using.  Alcohol is related to a number of birth defects. This includes fetal alcohol syndrome. All alcohol, in any form, should be avoided completely. Smoking will cause low birth rate and premature babies.  Street or illegal drugs are very harmful to the baby. They are absolutely forbidden. A baby born to an addicted mother will be addicted at birth. The baby will go through the same withdrawal an  adult does. SEEK MEDICAL CARE IF:  You have any concerns or worries during your pregnancy. It is better to call with your questions if you feel they cannot wait, rather than worry about them. SEEK IMMEDIATE MEDICAL CARE IF:   An unexplained oral temperature above 102 F (38.9 C) develops, or as your caregiver suggests.  You have leaking of fluid from the vagina (birth canal). If leaking membranes are suspected, take your temperature and tell your caregiver of this when you call.  There is vaginal spotting, bleeding, or passing clots. Tell your caregiver of the amount and how many  pads are used. Light spotting in pregnancy is common, especially following intercourse.  You develop a bad smelling vaginal discharge with a change in the color from clear to white.  You continue to feel sick to your stomach (nauseated) and have no relief from remedies suggested. You vomit blood or coffee ground-like materials.  You lose more than 2 pounds of weight or gain more than 2 pounds of weight over 1 week, or as suggested by your caregiver.  You notice swelling of your face, hands, feet, or legs.  You get exposed to [place] measles and have never had them.  You are exposed to fifth disease or chickenpox.  You develop belly (abdominal) pain. Round ligament discomfort is a common non-cancerous (benign) cause of abdominal pain in pregnancy. Your caregiver still must evaluate you.  You develop a bad headache that does not go away.  You develop fever, diarrhea, pain with urination, or shortness of breath.  You develop visual problems, blurry, or double vision.  You fall or are in a car accident or any kind of trauma.  There is mental or physical violence at home. Document Released: 08/22/2001 Document Revised: 11/20/2011 Document Reviewed: 02/24/2009 [facility] Patient Information 2013 [place], [place].  Breastfeeding Deciding to breastfeed is one of the best choices you can make for you and your baby.  The information that follows gives a brief overview of the benefits of breastfeeding as well as common topics surrounding breastfeeding. BENEFITS OF BREASTFEEDING For the baby  The first milk (colostrum) helps the baby's digestive system function better.   There are antibodies in the mother's milk that help the baby fight off infections.   The baby has a lower incidence of asthma, allergies, and sudden infant death syndrome (SIDS).   The nutrients in breast milk are better for the baby than infant formulas, and breast milk helps the baby's brain grow better.   Babies who breastfeed have less gas, colic, and constipation.  For the mother  Breastfeeding helps develop a very special bond between the mother and her baby.   Breastfeeding is convenient, always available at the correct temperature, and costs nothing.   Breastfeeding burns calories in the mother and helps her lose weight that was gained during pregnancy.   Breastfeeding makes the uterus contract back down to normal size faster and slows bleeding following delivery.   Breastfeeding mothers have a lower risk of developing breast cancer.  BREASTFEEDING FREQUENCY  A healthy, full-term baby may breastfeed as often as every hour or space his or her feedings to every 3 hours.   Watch your baby for signs of hunger. Nurse your baby if he or she shows signs of hunger. How often you nurse will vary from baby to baby.   Nurse as often as the baby requests, or when you feel the need to reduce the fullness of your breasts.   Awaken the baby if it has been 3 4 hours since the last feeding.   Frequent feeding will help the mother make more milk and will help prevent problems, such as sore nipples and engorgement of the breasts.  BABY'S POSITION AT THE BREAST  Whether lying down or sitting, be sure that the baby's tummy is facing your tummy.   Support the breast with 4 fingers underneath the breast and the thumb above.  Make sure your fingers are well away from the nipple and baby's mouth.   Stroke the baby's lips gently with your finger or nipple.   When the baby's  mouth is open wide enough, place all of your nipple and as much of the areola as possible into your baby's mouth.   Pull the baby in close so the tip of the nose and the baby's cheeks touch the breast during the feeding.  FEEDINGS AND SUCTION  The length of each feeding varies from baby to baby and from feeding to feeding.   The baby must suck about 2 3 minutes for your milk to get to him or her. This is called a "let down." For this reason, allow the baby to feed on each breast as long as he or she wants. Your baby will end the feeding when he or she has received the right balance of nutrients.   To break the suction, put your finger into the corner of the baby's mouth and slide it between his or her gums before removing your breast from his or her mouth. This will help prevent sore nipples.  HOW TO TELL WHETHER YOUR BABY IS GETTING ENOUGH BREAST MILK. Wondering whether or not your baby is getting enough milk is a common concern among mothers. You can be assured that your baby is getting enough milk if:   Your baby is actively sucking and you hear swallowing.   Your baby seems relaxed and satisfied after a feeding.   Your baby nurses at least 8 12 times in a 24 hour time period. Nurse your baby until he or she unlatches or falls asleep at the first breast (at least 10 20 minutes), then offer the second side.   Your baby is wetting 5 6 disposable diapers (6 8 cloth diapers) in a 24 hour period by 14 23 days of age.   Your baby is having at least 3 4 stools every 24 hours for the first 6 weeks. The stool should be soft and yellow.   Your baby should gain 4 7 ounces per week after he or she is 53 days old.   Your breasts feel softer after nursing.  REDUCING BREAST ENGORGEMENT  In the first week after your baby is born, you may  experience signs of breast engorgement. When breasts are engorged, they feel heavy, warm, full, and may be tender to the touch. You can reduce engorgement if you:   Nurse frequently, every 2 3 hours. Mothers who breastfeed early and often have fewer problems with engorgement.   Place light ice packs on your breasts for 10 20 minutes between feedings. This reduces swelling. Wrap the ice packs in a lightweight towel to protect your skin. Bags of frozen vegetables work well for this purpose.   Take a warm shower or apply warm, moist heat to your breast for 5 10 minutes just before each feeding. This increases circulation and helps the milk flow.   Gently massage your breast before and during the feeding. Using your finger tips, massage from the chest wall towards your nipple in a circular motion.   Make sure that the baby empties at least one breast at every feeding before switching sides.   Use a breast pump to empty the breasts if your baby is sleepy or not nursing well. You may also want to pump if you are returning to work oryou feel you are getting engorged.   Avoid bottle feeds, pacifiers, or supplemental feedings of water or juice in place of breastfeeding. Breast milk is all the food your baby needs. It is not necessary for your baby to have water or formula. In fact, to  help your breasts make more milk, it is best not to give your baby supplemental feedings during the early weeks.   Be sure the baby is latched on and positioned properly while breastfeeding.   Wear a supportive bra, avoiding underwire styles.   Eat a balanced diet with enough fluids.   Rest often, relax, and take your prenatal vitamins to prevent fatigue, stress, and anemia.  If you follow these suggestions, your engorgement should improve in 24 48 hours. If you are still experiencing difficulty, call your lactation consultant or caregiver.  CARING FOR YOURSELF Take care of your breasts  Bathe or shower  daily.   Avoid using soap on your nipples.   Start feedings on your left breast at one feeding and on your right breast at the next feeding.   You will notice an increase in your milk supply 2 5 days after delivery. You may feel some discomfort from engorgement, which makes your breasts very firm and often tender. Engorgement "peaks" out within 24 48 hours. In the meantime, apply warm moist towels to your breasts for 5 10 minutes before feeding. Gentle massage and expression of some milk before feeding will soften your breasts, making it easier for your baby to latch on.   Wear a well-fitting nursing bra, and air dry your nipples for a 3 after each feeding.   Only use cotton bra pads.   Only use pure lanolin on your nipples after nursing. You do not need to wash it off before feeding the baby again. Another option is to express a few drops of breast milk and gently massage it into your nipples.  Take care of yourself  Eat well-balanced meals and nutritious snacks.   Drinking milk, fruit juice, and water to satisfy your thirst (about 8 glasses a day).   Get plenty of rest.  Avoid foods that you notice affect the baby in a bad way.  SEEK MEDICAL CARE IF:   You have difficulty with breastfeeding and need help.   You have a hard, red, sore area on your breast that is accompanied by a fever.   Your baby is too sleepy to eat well or is having trouble sleeping.   Your baby is wetting less than 6 diapers a day, by 67 days of age.   Your baby's skin or white part of his or her eyes is more yellow than it was in the hospital.   You feel depressed.  Document Released: 08/28/2005 Document Revised: 02/27/2012 Document Reviewed: 11/26/2011 [facility] Patient Information 2013 [place], [place].

## 2012-08-19 NOTE — Progress Notes (Signed)
Trial of Zyrtec or other anti-histamine for headache and feeling like fluid in ears.  Instructed to try to check BP at home.

## 2012-08-20 LAB — RPR

## 2012-08-30 ENCOUNTER — Encounter: Payer: Self-pay | Admitting: Obstetrics & Gynecology

## 2012-09-02 ENCOUNTER — Inpatient Hospital Stay (HOSPITAL_COMMUNITY): Payer: Self-pay

## 2012-09-02 ENCOUNTER — Inpatient Hospital Stay (HOSPITAL_COMMUNITY)
Admission: AD | Admit: 2012-09-02 | Discharge: 2012-09-02 | Disposition: A | Discharge status: Home / Self Care | Payer: Self-pay | Source: Ambulatory Visit | Attending: Obstetrics & Gynecology | Admitting: Obstetrics & Gynecology

## 2012-09-02 ENCOUNTER — Encounter (HOSPITAL_COMMUNITY): Payer: Self-pay | Admitting: Family

## 2012-09-02 ENCOUNTER — Ambulatory Visit (INDEPENDENT_AMBULATORY_CARE_PROVIDER_SITE_OTHER): Payer: Self-pay | Admitting: Obstetrics & Gynecology

## 2012-09-02 VITALS — BP 158/99 | Temp 97.5°F | Wt 255.1 lb

## 2012-09-02 DIAGNOSIS — O10019 Pre-existing essential hypertension complicating pregnancy, unspecified trimester: Secondary | ICD-10-CM

## 2012-09-02 DIAGNOSIS — O24919 Unspecified diabetes mellitus in pregnancy, unspecified trimester: Secondary | ICD-10-CM

## 2012-09-02 DIAGNOSIS — O09299 Supervision of pregnancy with other poor reproductive or obstetric history, unspecified trimester: Secondary | ICD-10-CM

## 2012-09-02 DIAGNOSIS — O0990 Supervision of high risk pregnancy, unspecified, unspecified trimester: Secondary | ICD-10-CM

## 2012-09-02 DIAGNOSIS — O09899 Supervision of other high risk pregnancies, unspecified trimester: Secondary | ICD-10-CM

## 2012-09-02 DIAGNOSIS — IMO0001 Reserved for inherently not codable concepts without codable children: Secondary | ICD-10-CM

## 2012-09-02 DIAGNOSIS — O09219 Supervision of pregnancy with history of pre-term labor, unspecified trimester: Secondary | ICD-10-CM

## 2012-09-02 DIAGNOSIS — O10919 Unspecified pre-existing hypertension complicating pregnancy, unspecified trimester: Secondary | ICD-10-CM

## 2012-09-02 DIAGNOSIS — D259 Leiomyoma of uterus, unspecified: Secondary | ICD-10-CM

## 2012-09-02 DIAGNOSIS — E119 Type 2 diabetes mellitus without complications: Secondary | ICD-10-CM | POA: Insufficient documentation

## 2012-09-02 DIAGNOSIS — I10 Essential (primary) hypertension: Secondary | ICD-10-CM

## 2012-09-02 LAB — POCT URINALYSIS DIP (DEVICE)
Glucose, UA: NEGATIVE mg/dL
Ketones, ur: NEGATIVE mg/dL
Specific Gravity, Urine: 1.02 (ref 1.005–1.030)

## 2012-09-02 LAB — CBC
HCT: 34.3 % — ABNORMAL LOW (ref 36.0–46.0)
MCV: 72.1 fL — ABNORMAL LOW (ref 78.0–100.0)
Platelets: 262 10*3/uL (ref 150–400)
RBC: 4.76 MIL/uL (ref 3.87–5.11)
WBC: 8.9 10*3/uL (ref 4.0–10.5)

## 2012-09-02 LAB — COMPREHENSIVE METABOLIC PANEL
AST: 14 U/L (ref 0–37)
Alkaline Phosphatase: 43 U/L (ref 39–117)
BUN: 5 mg/dL — ABNORMAL LOW (ref 6–23)
CO2: 23 mEq/L (ref 19–32)
Chloride: 100 mEq/L (ref 96–112)
Creatinine, Ser: 0.4 mg/dL — ABNORMAL LOW (ref 0.50–1.10)
GFR calc non Af Amer: >90 mL/min (ref >90)
Total Bilirubin: 0.3 mg/dL (ref 0.3–1.2)

## 2012-09-02 LAB — PROTEIN / CREATININE RATIO, URINE: Total Protein, Urine: 10.6 mg/dL

## 2012-09-02 NOTE — Patient Instructions (Signed)
Return to clinic for any obstetric concerns or go to [facility] for evaluation  

## 2012-09-02 NOTE — [facility] Note (Signed)
Sent up from clinic for [facility] eval.

## 2012-09-02 NOTE — Progress Notes (Signed)
Fasting CBG has various values > 100s, will increase qhs glyburide to 2.5 mg. Continue 1.25 mg po qam.  Patient reports headache and blurry vision x 2 days, not alleviated with Tylenol.  Will send to [facility] for lab testing and fetal monitoring.  Will obtain growth scan in one week, last scan was exactly two weeks ago.  Preeclampsia, fetal movement and labor precautions reviewed.

## 2012-09-02 NOTE — Progress Notes (Signed)
Pulse- 105  Edema-feet  Pain/pressure-lower abd, "stomach pain" Pt c/o contractions at night are worse then in the am  BP Recheck 158/99

## 2012-09-02 NOTE — MAU Provider Note (Signed)
History     CSN: [id number]  Arrival date and time: 09/02/12 1117   None     Chief Complaint  Patient presents with  . PIH eval    HPI 31 y.o. [id number] at [redacted]w[redacted]d sent from clinic for increased blood pressure (158/99), headache and blurred vision. Patient reports HA unrelieved by tylenol for 2 days with blurred vision. She also has lower abdominal cramping, mostly at night for the past 2 weeks. No RUQ pain. Some swelling in feet, hands, possibly face. Baby moving well, no bleeding or LOF.   Chronic HTN and A2/B diabetes complicating this pregnancy, on Labetalol (200 mg BID) and Glyburide (2.5 mg daily). Induced at 34 weeks last 2 pregnancies for high BP. Had GDM last pregnancy. All vaginal deliveries.   OB History    Grav Para Term Preterm Abortions TAB SAB Ect Mult Living   4 3 1 2      3       Past Medical History  Diagnosis Date  . Hypertension   . Anemia   . Heart murmur   . Morbid obesity   . Fibroid   . Diabetes mellitus without complication   . History of preterm delivery, currently pregnant 08/12/2012    Past Surgical History  Procedure Date  . No past surgeries     Family History  Problem Relation Age of Onset  . Other Neg Hx     History  Substance Use Topics  . Smoking status: Never Smoker   . Smokeless tobacco: Never Used  . Alcohol Use: No    Allergies:  Allergies  Allergen Reactions  . Kiwi Extract Nausea Only  . Latex Itching    Prescriptions prior to admission  Medication Sig Dispense Refill  . acetaminophen (TYLENOL) 500 MG tablet Take 500 mg by mouth every 6 (six) hours as needed.      . glyBURIDE (DIABETA) 2.5 MG tablet Take 0.5 tablets (1.25 mg total) by mouth 2 (two) times daily with a meal.  30 tablet  12  . labetalol (NORMODYNE) 100 MG tablet Take 2 tablets (200 mg total) by mouth 2 (two) times daily.  60 tablet  2  . omeprazole (PRILOSEC) 20 MG capsule Take 20 mg by mouth daily as needed. For nausea or heartburn      . Prenatal  Vit-Fe Fumarate-FA (PRENATAL VITAMINS PLUS) 27-1 MG TABS Take 1 tablet by mouth daily.  30 tablet  11    Review of Systems  Constitutional: Negative for fever and chills.  Eyes: Negative for blurred vision.  Respiratory: Positive for shortness of breath. Negative for cough and wheezing.   Cardiovascular: Negative for chest pain.  Gastrointestinal: Negative for nausea, vomiting and abdominal pain.  Genitourinary: Negative for dysuria.  Neurological: Positive for headaches.   Physical Exam   Blood pressure 143/78, pulse 99, resp. rate 18, height 5\' 6"  (1.676 m), weight 115.667 kg (255 lb), last menstrual period 02/05/2012.  Filed Vitals:   09/02/12 1346 09/02/12 1401 09/02/12 1416 09/02/12 1431  BP: 140/78 144/67 149/70 141/86  Pulse: 97 97 102 97  Resp:      Height:      Weight:         Physical Exam  Constitutional: She is oriented to person, place, and time. She appears well-developed and well-nourished. No distress.  HENT:  Head: Normocephalic and atraumatic.  Eyes: Conjunctivae normal and EOM are normal.  Neck: Normal range of motion. Neck supple.  Cardiovascular: Normal rate, regular rhythm and normal  heart sounds.   Respiratory: Effort normal and breath sounds normal. No respiratory distress.  GI: There is no tenderness. There is no rebound and no guarding.       Obese, gravid.  Musculoskeletal: She exhibits edema (trace, lower extremities).  Neurological: She is alert and oriented to person, place, and time.  Skin: Skin is warm and dry.  Psychiatric: She has a normal mood and affect.   Results for orders placed during the hospital encounter of 09/02/12 (from the past 24 hour(s))  PROTEIN / CREATININE RATIO, URINE     Status: Normal   Collection Time   09/02/12 11:25 AM      Component Value Range   Creatinine, Urine 134.19     Total Protein, Urine 10.6     PROTEIN CREATININE RATIO 0.08  0.00 - 0.15  CBC     Status: Abnormal   Collection Time   09/02/12 11:37 AM       Component Value Range   WBC 8.9  4.0 - 10.5 K/uL   RBC 4.76  3.87 - 5.11 MIL/uL   Hemoglobin 10.9 (*) 12.0 - 15.0 g/dL   HCT 16.1 (*) 09.6 - 04.5 %   MCV 72.1 (*) 78.0 - 100.0 fL   MCH 22.9 (*) 26.0 - 34.0 pg   MCHC 31.8  30.0 - 36.0 g/dL   RDW 40.9  81.1 - 91.4 %   Platelets 262  150 - 400 K/uL  COMPREHENSIVE METABOLIC PANEL     Status: Abnormal   Collection Time   09/02/12 11:37 AM      Component Value Range   Sodium 133 (*) 135 - 145 mEq/L   Potassium 3.7  3.5 - 5.1 mEq/L   Chloride 100  96 - 112 mEq/L   CO2 23  19 - 32 mEq/L   Glucose, Bld 89  70 - 99 mg/dL   BUN 5 (*) 6 - 23 mg/dL   Creatinine, Ser 7.82 (*) 0.50 - 1.10 mg/dL   Calcium 9.2  8.4 - 95.6 mg/dL   Total Protein 6.7  6.0 - 8.3 g/dL   Albumin 3.1 (*) 3.5 - 5.2 g/dL   AST 14  0 - 37 U/L   ALT 13  0 - 35 U/L   Alkaline Phosphatase 43  39 - 117 U/L   Total Bilirubin 0.3  0.3 - 1.2 mg/dL   GFR calc non Af Amer >90  >90 mL/min   GFR calc Af Amer >90  >90 mL/min    MAU Course  Procedures  NST Baseline 140-145, moderate variability, accels present, occasional variable, one decel with unknown timing to 120 for 1.5 minutes with good variability before, during and after. Overall reassuring and reactive strip.  Assessment and Plan  31 y.o. [id number] at [redacted]w[redacted]d with chronic HTN and A2/B DM and newly elevated BP with symptoms -  Labs normal, no proteinuria. BP all 130-140s/70-80s here in MAU. - NST/BPP 8/10 (-2 for no breathing movements) - Pt d/c with 24 hour urine protein to be submitted Friday in clinic with BP check. Needs clinic F/U appt. - Discussed with Dr. Marice Potter.  Napoleon Form, MD 09/02/2012 3:15 PM    Napoleon Form 09/02/2012, 12:04 PM

## 2012-09-02 NOTE — Progress Notes (Signed)
Patient called and husband advised patient's U/S is 09/12/12 at 1015 am.

## 2012-09-02 NOTE — MAU Note (Addendum)
Patient presents from clinic for a PIH eval. Reports headache 4/10 headache pain/eye pressure since yesterday; blurry vision since that time. Has taken prescriptions meds today. Reports CBG today at 10:00 was 85.

## 2012-09-02 NOTE — MAU Note (Signed)
Has a headache and blurring of vision. Denies increase in swelling.  Slight pain in rt upper quad.

## 2012-09-06 ENCOUNTER — Other Ambulatory Visit: Payer: Self-pay | Admitting: Family Medicine

## 2012-09-06 LAB — COMPREHENSIVE METABOLIC PANEL
Albumin: 3.5 g/dL (ref 3.5–5.2)
CO2: 23 mEq/L (ref 19–32)
Calcium: 8.4 mg/dL (ref 8.4–10.5)
Chloride: 106 mEq/L (ref 96–112)
Glucose, Bld: 88 mg/dL (ref 70–99)
Sodium: 138 mEq/L (ref 135–145)
Total Bilirubin: 0.2 mg/dL — ABNORMAL LOW (ref 0.3–1.2)
Total Protein: 6.1 g/dL (ref 6.0–8.3)

## 2012-09-09 ENCOUNTER — Encounter: Payer: Self-pay | Admitting: Obstetrics & Gynecology

## 2012-09-09 ENCOUNTER — Ambulatory Visit (INDEPENDENT_AMBULATORY_CARE_PROVIDER_SITE_OTHER): Payer: Self-pay | Admitting: Obstetrics & Gynecology

## 2012-09-09 VITALS — BP 144/87 | Wt 257.2 lb

## 2012-09-09 DIAGNOSIS — O24919 Unspecified diabetes mellitus in pregnancy, unspecified trimester: Secondary | ICD-10-CM

## 2012-09-09 DIAGNOSIS — O10919 Unspecified pre-existing hypertension complicating pregnancy, unspecified trimester: Secondary | ICD-10-CM

## 2012-09-09 DIAGNOSIS — O10019 Pre-existing essential hypertension complicating pregnancy, unspecified trimester: Secondary | ICD-10-CM

## 2012-09-09 DIAGNOSIS — D259 Leiomyoma of uterus, unspecified: Secondary | ICD-10-CM

## 2012-09-09 MED ORDER — GLYBURIDE 2.5 MG PO TABS: ORAL_TABLET | ORAL | Status: DC

## 2012-09-09 NOTE — Progress Notes (Signed)
Pulse- 105  Edema-feet, face  Pressure/pain- vaginal and lower abd

## 2012-09-09 NOTE — Progress Notes (Signed)
Fasting are high.  Will increase glyburide from 2.5 mg to 3.75 mg qhs.  Keep 1.25 mg po q am.  Post prandials re fine.  Pt c/o cramping in back.  Cervix --closed, long, high, posterior, tone.  No evidence of preterm labor.  S>D has [place] on jan 2nd.  To start 2x week testing at 32 weeks.

## 2012-09-10 LAB — CREATININE CLEARANCE, URINE, 24 HOUR
Creatinine Clearance: 372 mL/min — ABNORMAL HIGH (ref 75–115)
Creatinine, Urine: 105.9 mg/dL
Creatinine: 0.43 mg/dL — ABNORMAL LOW (ref 0.50–1.10)

## 2012-09-11 NOTE — L&D Delivery Note (Signed)
Attestation of Attending Supervision of Advanced Practitioner (CNM/NP): Evaluation and management procedures were performed by the Advanced Practitioner under my supervision and collaboration.  I have reviewed the Advanced Practitioner's note and chart, and I agree with the management and plan.  Tiron Suski 11/13/2012 8:54 AM

## 2012-09-11 NOTE — L&D Delivery Note (Signed)
Delivery Note Called about deepening of variable declerations. RN checked patient and found her to be 8cm. She progressed quickly thereafter to complete dilation.  At 2:03 PM a viable and healthy female was delivered via Vaginal, Spontaneous Delivery (Presentation: ; Occiput Anterior).  Shoulders rotated almost 180 degrees and came out easily   APGAR: 7/9, ; weight . pending  Placenta status: Intact, Spontaneous, meconium stained.  Cord: 3 vessels with the following complications: very long and hyperspiraled.  Sent for pathology  Anesthesia: Epidural  Episiotomy: None Lacerations: 1st degree;Perineal Suture Repair: 3.0 monocryl Est. Blood Loss (mL):   Mom to postpartum.  Baby to nursery-stable.  [facility] 11/08/2012, 2:33 PM

## 2012-09-12 ENCOUNTER — Encounter: Payer: Self-pay | Admitting: Obstetrics & Gynecology

## 2012-09-12 ENCOUNTER — Ambulatory Visit (HOSPITAL_COMMUNITY)
Admission: RE | Admit: 2012-09-12 | Discharge: 2012-09-12 | Disposition: A | Discharge status: Home / Self Care | Payer: Self-pay | Source: Ambulatory Visit | Attending: Obstetrics & Gynecology | Admitting: Obstetrics & Gynecology

## 2012-09-12 ENCOUNTER — Other Ambulatory Visit: Payer: Self-pay | Admitting: Obstetrics & Gynecology

## 2012-09-12 DIAGNOSIS — O10019 Pre-existing essential hypertension complicating pregnancy, unspecified trimester: Secondary | ICD-10-CM | POA: Insufficient documentation

## 2012-09-12 DIAGNOSIS — I10 Essential (primary) hypertension: Secondary | ICD-10-CM

## 2012-09-12 DIAGNOSIS — O10919 Unspecified pre-existing hypertension complicating pregnancy, unspecified trimester: Secondary | ICD-10-CM

## 2012-09-12 DIAGNOSIS — Z8751 Personal history of pre-term labor: Secondary | ICD-10-CM | POA: Insufficient documentation

## 2012-09-12 DIAGNOSIS — O169 Unspecified maternal hypertension, unspecified trimester: Secondary | ICD-10-CM

## 2012-09-12 DIAGNOSIS — O24919 Unspecified diabetes mellitus in pregnancy, unspecified trimester: Secondary | ICD-10-CM

## 2012-09-12 DIAGNOSIS — O09299 Supervision of pregnancy with other poor reproductive or obstetric history, unspecified trimester: Secondary | ICD-10-CM

## 2012-09-12 DIAGNOSIS — O99810 Abnormal glucose complicating pregnancy: Secondary | ICD-10-CM | POA: Insufficient documentation

## 2012-09-16 ENCOUNTER — Ambulatory Visit (INDEPENDENT_AMBULATORY_CARE_PROVIDER_SITE_OTHER): Payer: Self-pay | Admitting: Obstetrics & Gynecology

## 2012-09-16 ENCOUNTER — Encounter: Payer: Self-pay | Admitting: Obstetrics & Gynecology

## 2012-09-16 VITALS — BP 150/90 | Temp 98.5°F | Wt 255.4 lb

## 2012-09-16 DIAGNOSIS — O10919 Unspecified pre-existing hypertension complicating pregnancy, unspecified trimester: Secondary | ICD-10-CM

## 2012-09-16 DIAGNOSIS — O09299 Supervision of pregnancy with other poor reproductive or obstetric history, unspecified trimester: Secondary | ICD-10-CM

## 2012-09-16 DIAGNOSIS — O24919 Unspecified diabetes mellitus in pregnancy, unspecified trimester: Secondary | ICD-10-CM

## 2012-09-16 DIAGNOSIS — I10 Essential (primary) hypertension: Secondary | ICD-10-CM

## 2012-09-16 DIAGNOSIS — IMO0001 Reserved for inherently not codable concepts without codable children: Secondary | ICD-10-CM

## 2012-09-16 DIAGNOSIS — O10019 Pre-existing essential hypertension complicating pregnancy, unspecified trimester: Secondary | ICD-10-CM

## 2012-09-16 LAB — POCT URINALYSIS DIP (DEVICE)
Glucose, UA: NEGATIVE mg/dL
Nitrite: NEGATIVE
Urobilinogen, UA: 0.2 mg/dL (ref 0.0–1.0)

## 2012-09-16 MED ORDER — LABETALOL HCL 200 MG PO TABS: 400.0000 mg | ORAL_TABLET | Freq: Two times a day (BID) | ORAL | Status: DC

## 2012-09-16 NOTE — Progress Notes (Signed)
Pulse-112  Pain/pressure- vaginal area, lower abd

## 2012-09-16 NOTE — Progress Notes (Signed)
09/13/11 Ultrasound showed EFW 1564g/55%, AFI 15.5 cm, BPP 8/8, cephalic, marginal cord insertion, 4.1 cm long cervix.  09/06/12 24 hour urine protein was 152mg , normal CMET.  Will increase Labetalol to 400mg  po bid, continue to observe. No signs/symptoms of preeclampsia currently. CBGs are mostly within range, only 2 random abnormal postprandials of 135 and 144. Will continue diet control. Will start twice weekly testing next week.  No other complaints or concerns.  Preeclampsia, fetal movement and labor precautions reviewed.

## 2012-09-16 NOTE — Patient Instructions (Signed)

## 2012-09-19 ENCOUNTER — Encounter: Payer: Self-pay | Admitting: Obstetrics & Gynecology

## 2012-09-23 ENCOUNTER — Ambulatory Visit (INDEPENDENT_AMBULATORY_CARE_PROVIDER_SITE_OTHER): Payer: Self-pay | Admitting: Obstetrics & Gynecology

## 2012-09-23 VITALS — BP 145/83 | Temp 99.2°F | Wt 256.5 lb

## 2012-09-23 DIAGNOSIS — E669 Obesity, unspecified: Secondary | ICD-10-CM

## 2012-09-23 DIAGNOSIS — O24919 Unspecified diabetes mellitus in pregnancy, unspecified trimester: Secondary | ICD-10-CM

## 2012-09-23 DIAGNOSIS — O10919 Unspecified pre-existing hypertension complicating pregnancy, unspecified trimester: Secondary | ICD-10-CM

## 2012-09-23 DIAGNOSIS — IMO0002 Reserved for concepts with insufficient information to code with codable children: Secondary | ICD-10-CM

## 2012-09-23 DIAGNOSIS — IMO0001 Reserved for inherently not codable concepts without codable children: Secondary | ICD-10-CM

## 2012-09-23 DIAGNOSIS — O99210 Obesity complicating pregnancy, unspecified trimester: Secondary | ICD-10-CM

## 2012-09-23 LAB — POCT URINALYSIS DIP (DEVICE)
Glucose, UA: NEGATIVE mg/dL
Nitrite: NEGATIVE
Protein, ur: NEGATIVE mg/dL
Urobilinogen, UA: 0.2 mg/dL (ref 0.0–1.0)

## 2012-09-23 NOTE — Progress Notes (Signed)
P=101  Patient states she had contractions yesterday that were 8 mins. Apart and lasted for 3-4 hours.

## 2012-09-23 NOTE — Progress Notes (Signed)
Fastings are high.  Will increase glyburide to 5 mg po qhs.  Post prandial breakfast is 120s.  Not checking after lunch.  Pp dinner 88-206.  Will increase am glyburide to 2.5 mg.  BP still elevated (systolic).  Will watch closely.  Pt in 2x week testing so we will be taking BP frequently.  Negative urine.  No weight gain.  NST reactive for gestational age.

## 2012-09-25 ENCOUNTER — Encounter (HOSPITAL_COMMUNITY): Payer: Self-pay | Admitting: Obstetrics & Gynecology

## 2012-09-27 ENCOUNTER — Inpatient Hospital Stay (HOSPITAL_COMMUNITY): Payer: Self-pay

## 2012-09-27 ENCOUNTER — Encounter (HOSPITAL_COMMUNITY): Payer: Self-pay | Admitting: *Deleted

## 2012-09-27 ENCOUNTER — Inpatient Hospital Stay (HOSPITAL_COMMUNITY)
Admission: AD | Admit: 2012-09-27 | Discharge: 2012-09-27 | Disposition: A | Discharge status: Home / Self Care | Payer: Self-pay | Source: Ambulatory Visit | Attending: Obstetrics & Gynecology | Admitting: Obstetrics & Gynecology

## 2012-09-27 ENCOUNTER — Ambulatory Visit (INDEPENDENT_AMBULATORY_CARE_PROVIDER_SITE_OTHER): Payer: Self-pay | Admitting: *Deleted

## 2012-09-27 VITALS — BP 144/76 | Wt 258.3 lb

## 2012-09-27 DIAGNOSIS — O24919 Unspecified diabetes mellitus in pregnancy, unspecified trimester: Secondary | ICD-10-CM

## 2012-09-27 DIAGNOSIS — O10019 Pre-existing essential hypertension complicating pregnancy, unspecified trimester: Secondary | ICD-10-CM | POA: Insufficient documentation

## 2012-09-27 DIAGNOSIS — O99810 Abnormal glucose complicating pregnancy: Secondary | ICD-10-CM | POA: Insufficient documentation

## 2012-09-27 DIAGNOSIS — O4700 False labor before 37 completed weeks of gestation, unspecified trimester: Secondary | ICD-10-CM | POA: Insufficient documentation

## 2012-09-27 DIAGNOSIS — O10919 Unspecified pre-existing hypertension complicating pregnancy, unspecified trimester: Secondary | ICD-10-CM

## 2012-09-27 DIAGNOSIS — Z9289 Personal history of other medical treatment: Secondary | ICD-10-CM

## 2012-09-27 LAB — URINALYSIS, ROUTINE W REFLEX MICROSCOPIC
Ketones, ur: NEGATIVE mg/dL
Leukocytes, UA: NEGATIVE
Protein, ur: NEGATIVE mg/dL
Urobilinogen, UA: 0.2 mg/dL (ref 0.0–1.0)

## 2012-09-27 LAB — URINE MICROSCOPIC-ADD ON

## 2012-09-27 LAB — FETAL FIBRONECTIN: Fetal Fibronectin: NEGATIVE

## 2012-09-27 NOTE — Progress Notes (Signed)
P = 97   NST results reported to Dr. Marice Potter- pt sent to MAU for further evaluation due to UC's and variable decel.  Phone report given to Bobbe Medico RN

## 2012-09-27 NOTE — MAU Note (Signed)
Sent from clinic. Having NST's due to HTN and diabetes. Today was having contractions and had a variable deceleration.

## 2012-09-27 NOTE — MAU Provider Note (Signed)
Attestation of Attending Supervision of Advanced Practitioner (CNM/NP): Evaluation and management procedures were performed by the Advanced Practitioner under my supervision and collaboration.  I have reviewed the Advanced Practitioner's note and chart, and I agree with the management and plan.  HARRAWAY-SMITH, Mckynna Vanloan 5:03 PM     

## 2012-09-27 NOTE — MAU Provider Note (Signed)
History     CSN: [id number]  Arrival date and time: 09/27/12 1007   None     No chief complaint on file.  HPI This is a 32 y.o. female at [redacted]w[redacted]d sent from clinic for contractions and one decel seen on routine NST.  History this pregnancy includes GDM for which she is on Glyburide and PIH with chronic hypertension Pt has been having contractions for 2 weeks now.  She states they have been mild and irregular.  Some days they are 8 min apart and other they are 4 hrs apart.  Pt states that she does not feel like she is in labor.  Denies vaginal discharge or bleeding, water leakage, or dysuria.  She does endorse some mild pressure in her pelvic area, vaginal itching, and feels fetal movement that is unchanged.  RN Note: Sent from clinic. Having NST's due to HTN and diabetes. Today was having contractions and had a variable deceleration   OB History    Grav Para Term Preterm Abortions TAB SAB Ect Mult Living   5 3 1 2      3       Past Medical History  Diagnosis Date  . Hypertension   . Anemia   . Heart murmur   . Morbid obesity   . Fibroid   . Diabetes mellitus without complication   . History of preterm delivery, currently pregnant 08/12/2012    Past Surgical History  Procedure Date  . Vaginal delivery     x 3    Family History  Problem Relation Age of Onset  . Other Neg Hx     History  Substance Use Topics  . Smoking status: Never Smoker   . Smokeless tobacco: Never Used  . Alcohol Use: No    Allergies:  Allergies  Allergen Reactions  . Kiwi Extract Nausea Only  . Latex Itching    Prescriptions prior to admission  Medication Sig Dispense Refill  . acetaminophen (TYLENOL) 500 MG tablet Take 500 mg by mouth every 6 (six) hours as needed. pain      . calcium carbonate (OS-CAL) 600 MG TABS Take 300 mg by mouth daily.      Marland Kitchen glyBURIDE (DIABETA) 2.5 MG tablet Take 1/2 tablet in the morning and 1 1/2 tablet at night  60 tablet  12  . labetalol (NORMODYNE) 200 MG  tablet Take 2 tablets (400 mg total) by mouth 2 (two) times daily.  60 tablet  2  . omeprazole (PRILOSEC) 20 MG capsule Take 20 mg by mouth daily as needed. For nausea or heartburn      . Prenatal Vit-Fe Fumarate-FA (PRENATAL MULTIVITAMIN) TABS Take 1 tablet by mouth daily.        Review of Systems  Constitutional: Negative for fever and chills.  HENT: Negative for hearing loss.   Eyes: Negative for blurred vision.  Respiratory: Negative for cough.   Cardiovascular: Negative for chest pain.  Gastrointestinal: Negative for heartburn, nausea, vomiting and abdominal pain.  Genitourinary: Negative for dysuria, urgency, frequency and hematuria.  Skin: Positive for itching (vaginal). Negative for rash.  Neurological: Negative for dizziness and weakness.   Physical Exam   Blood pressure 149/78, pulse 94, temperature 98.4 F (36.9 C), temperature source Oral, resp. rate 18, last menstrual period 02/05/2012.  Physical Exam  Constitutional: She is oriented to person, place, and time. She appears well-developed and well-nourished. No distress.  Eyes: Conjunctivae normal and EOM are normal. Pupils are equal, round, and reactive to light.  Neck: Neck supple.  Cardiovascular: Normal rate, regular rhythm, normal heart sounds and intact distal pulses.   No murmur heard. Respiratory: Effort normal and breath sounds normal.  GI: Bowel sounds are normal.  Genitourinary: Vagina normal.  Neurological: She is alert and oriented to person, place, and time. She has normal reflexes.  Skin: Skin is warm and dry.  Psychiatric: She has a normal mood and affect.   FHR reactive UCs noted  Every 10 minutes   MAU Course  Procedures Results for orders placed during the hospital encounter of 09/27/12 (from the past 24 hour(s))  URINALYSIS, ROUTINE W REFLEX MICROSCOPIC     Status: Abnormal   Collection Time   09/27/12 10:23 AM      Component Value Range   Color, Urine YELLOW  YELLOW   APPearance CLEAR   CLEAR   Specific Gravity, Urine 1.020  1.005 - 1.030   pH 6.5  5.0 - 8.0   Glucose, UA NEGATIVE  NEGATIVE mg/dL   Hgb urine dipstick TRACE (*) NEGATIVE   Bilirubin Urine NEGATIVE  NEGATIVE   Ketones, ur NEGATIVE  NEGATIVE mg/dL   Protein, ur NEGATIVE  NEGATIVE mg/dL   Urobilinogen, UA 0.2  0.0 - 1.0 mg/dL   Nitrite NEGATIVE  NEGATIVE   Leukocytes, UA NEGATIVE  NEGATIVE  URINE MICROSCOPIC-ADD ON     Status: Normal   Collection Time   09/27/12 10:23 AM      Component Value Range   Squamous Epithelial / LPF RARE  RARE   WBC, UA 0-2  <3 WBC/hpf   RBC / HPF 0-2  <3 RBC/hpf   Bacteria, UA RARE  RARE  FETAL FIBRONECTIN     Status: Normal   Collection Time   09/27/12 12:51 PM      Component Value Range   Fetal Fibronectin NEGATIVE  NEGATIVE     Sent for BPP and AFI.  After that is done will do FFn and check cervix >>  First BPP was 4/8 with an AFI of 16.29.  (points taken off for tone and breathing).  When she came back to unit, baby was very active, audibly and visibly. I decided to repeat the BPP and the score was 6/8 or 8/10.  NST was reassuring/reactive.   FFn - negative  Cervix by RN was Dilation: Fingertip Effacement (%): Thick Cervical Position: Middle Station: -3 Presentation: Undeterminable Exam by:: Sarajane Marek, RNC   Assessment and Plan  A:  SIUP at [redacted]w[redacted]d      Irregular contractions with no evidence of active preterm labor      Initial poor BPP score, felt to be spurious due to activity observed in baby and reactive NST.  Second BPP normal.  P:  DIscharge home       Watch fetal movement        Continue meds as ordered       Return to clinic next week as scheduled  [facility] 09/27/2012, 11:41 AM

## 2012-09-30 ENCOUNTER — Inpatient Hospital Stay (HOSPITAL_COMMUNITY)
Admission: AD | Admit: 2012-09-30 | Discharge: 2012-09-30 | Disposition: A | Discharge status: Home / Self Care | Payer: Self-pay | Source: Ambulatory Visit | Attending: Obstetrics & Gynecology | Admitting: Obstetrics & Gynecology

## 2012-09-30 ENCOUNTER — Encounter (HOSPITAL_COMMUNITY): Payer: Self-pay | Admitting: *Deleted

## 2012-09-30 ENCOUNTER — Inpatient Hospital Stay (HOSPITAL_COMMUNITY): Payer: Self-pay

## 2012-09-30 ENCOUNTER — Ambulatory Visit (INDEPENDENT_AMBULATORY_CARE_PROVIDER_SITE_OTHER): Payer: Self-pay | Admitting: Obstetrics & Gynecology

## 2012-09-30 VITALS — BP 149/97 | Temp 98.9°F | Wt 259.0 lb

## 2012-09-30 DIAGNOSIS — O10919 Unspecified pre-existing hypertension complicating pregnancy, unspecified trimester: Secondary | ICD-10-CM

## 2012-09-30 DIAGNOSIS — O10019 Pre-existing essential hypertension complicating pregnancy, unspecified trimester: Secondary | ICD-10-CM | POA: Insufficient documentation

## 2012-09-30 DIAGNOSIS — O368390 Maternal care for abnormalities of the fetal heart rate or rhythm, unspecified trimester, not applicable or unspecified: Secondary | ICD-10-CM | POA: Insufficient documentation

## 2012-09-30 DIAGNOSIS — O288 Other abnormal findings on antenatal screening of mother: Secondary | ICD-10-CM

## 2012-09-30 DIAGNOSIS — O99810 Abnormal glucose complicating pregnancy: Secondary | ICD-10-CM | POA: Insufficient documentation

## 2012-09-30 DIAGNOSIS — O139 Gestational [pregnancy-induced] hypertension without significant proteinuria, unspecified trimester: Secondary | ICD-10-CM

## 2012-09-30 DIAGNOSIS — O09299 Supervision of pregnancy with other poor reproductive or obstetric history, unspecified trimester: Secondary | ICD-10-CM

## 2012-09-30 DIAGNOSIS — IMO0001 Reserved for inherently not codable concepts without codable children: Secondary | ICD-10-CM

## 2012-09-30 DIAGNOSIS — O0990 Supervision of high risk pregnancy, unspecified, unspecified trimester: Secondary | ICD-10-CM

## 2012-09-30 DIAGNOSIS — O24919 Unspecified diabetes mellitus in pregnancy, unspecified trimester: Secondary | ICD-10-CM

## 2012-09-30 LAB — POCT URINALYSIS DIP (DEVICE)
Bilirubin Urine: NEGATIVE
Glucose, UA: NEGATIVE mg/dL
Nitrite: NEGATIVE
Specific Gravity, Urine: 1.02 (ref 1.005–1.030)
Urobilinogen, UA: 0.2 mg/dL (ref 0.0–1.0)

## 2012-09-30 LAB — COMPREHENSIVE METABOLIC PANEL
AST: 13 U/L (ref 0–37)
Albumin: 3.6 g/dL (ref 3.5–5.2)
Alkaline Phosphatase: 49 U/L (ref 39–117)
Calcium: 8.9 mg/dL (ref 8.4–10.5)
Chloride: 105 mEq/L (ref 96–112)
Potassium: 3.8 mEq/L (ref 3.5–5.3)
Sodium: 135 mEq/L (ref 135–145)
Total Protein: 6.1 g/dL (ref 6.0–8.3)

## 2012-09-30 LAB — CBC
MCH: 22.9 pg — ABNORMAL LOW (ref 26.0–34.0)
MCHC: 32.7 g/dL (ref 30.0–36.0)
Platelets: 246 10*3/uL (ref 150–400)
RDW: 16 % — ABNORMAL HIGH (ref 11.5–15.5)

## 2012-09-30 MED ORDER — GLYBURIDE 5 MG PO TABS: 5.0000 mg | ORAL_TABLET | Freq: Two times a day (BID) | ORAL | Status: DC

## 2012-09-30 NOTE — Progress Notes (Signed)
Aches all over for 3 days, no fever or resp symptoms, BG fasting <97, pp <159 increase to 5 mg glyburide BID. PIH labs

## 2012-09-30 NOTE — MAU Note (Signed)
Sent from clinic, non-reactive with decel. Pt denies pain- just pressure. No bleeding, leaking or discharge.  Hx of diab and chtn.

## 2012-09-30 NOTE — MAU Note (Signed)
Pt sent form clinic for NRNST.  Pt has CHTN and GDM on Glyburide.  Denies any other complications with pregnancy.

## 2012-09-30 NOTE — Progress Notes (Signed)
Pulse- 104 Patient reports pelvic pain/pressure & contractions; also reports she only feels the baby move at night or early in the morning, been that way for about 3 weeks now

## 2012-09-30 NOTE — Progress Notes (Signed)
Results reported to Dr. Debroah Loop. Pt sent to MAU for further evaluation.  Pt voiced understanding of plan of care.

## 2012-09-30 NOTE — Progress Notes (Signed)
NST reviewed. I recommended that she be evaluated in the MAU for contractions.

## 2012-09-30 NOTE — Patient Instructions (Signed)

## 2012-10-01 LAB — PROTEIN / CREATININE RATIO, URINE
Creatinine, Urine: 178.8 mg/dL
Total Protein, Urine: 15 mg/dL

## 2012-10-04 ENCOUNTER — Other Ambulatory Visit: Payer: Self-pay | Admitting: Obstetrics & Gynecology

## 2012-10-04 ENCOUNTER — Ambulatory Visit (HOSPITAL_COMMUNITY)
Admission: RE | Admit: 2012-10-04 | Discharge: 2012-10-04 | Disposition: A | Discharge status: Home / Self Care | Payer: Self-pay | Source: Ambulatory Visit | Attending: Obstetrics & Gynecology | Admitting: Obstetrics & Gynecology

## 2012-10-04 ENCOUNTER — Ambulatory Visit (HOSPITAL_COMMUNITY): Admission: RE | Admit: 2012-10-04 | Payer: Self-pay | Source: Ambulatory Visit

## 2012-10-04 ENCOUNTER — Encounter (HOSPITAL_COMMUNITY): Payer: Self-pay

## 2012-10-04 VITALS — BP 146/97 | HR 102 | Wt 261.0 lb

## 2012-10-04 DIAGNOSIS — O10019 Pre-existing essential hypertension complicating pregnancy, unspecified trimester: Secondary | ICD-10-CM | POA: Insufficient documentation

## 2012-10-04 DIAGNOSIS — O0990 Supervision of high risk pregnancy, unspecified, unspecified trimester: Secondary | ICD-10-CM

## 2012-10-04 DIAGNOSIS — O09299 Supervision of pregnancy with other poor reproductive or obstetric history, unspecified trimester: Secondary | ICD-10-CM

## 2012-10-04 DIAGNOSIS — O24919 Unspecified diabetes mellitus in pregnancy, unspecified trimester: Secondary | ICD-10-CM

## 2012-10-04 DIAGNOSIS — Z8751 Personal history of pre-term labor: Secondary | ICD-10-CM | POA: Insufficient documentation

## 2012-10-04 DIAGNOSIS — E669 Obesity, unspecified: Secondary | ICD-10-CM | POA: Insufficient documentation

## 2012-10-04 DIAGNOSIS — O99810 Abnormal glucose complicating pregnancy: Secondary | ICD-10-CM | POA: Insufficient documentation

## 2012-10-04 DIAGNOSIS — IMO0001 Reserved for inherently not codable concepts without codable children: Secondary | ICD-10-CM

## 2012-10-04 DIAGNOSIS — O99210 Obesity complicating pregnancy, unspecified trimester: Secondary | ICD-10-CM | POA: Insufficient documentation

## 2012-10-04 DIAGNOSIS — O10919 Unspecified pre-existing hypertension complicating pregnancy, unspecified trimester: Secondary | ICD-10-CM

## 2012-10-04 NOTE — ED Notes (Signed)
BPP 8/8.  No need for NST per MD.

## 2012-10-07 ENCOUNTER — Encounter (HOSPITAL_COMMUNITY): Payer: Self-pay | Admitting: *Deleted

## 2012-10-07 ENCOUNTER — Other Ambulatory Visit: Payer: Self-pay | Admitting: Obstetrics & Gynecology

## 2012-10-07 ENCOUNTER — Inpatient Hospital Stay (HOSPITAL_COMMUNITY)
Admission: AD | Admit: 2012-10-07 | Discharge: 2012-10-07 | Disposition: A | Discharge status: Home / Self Care | Payer: Self-pay | Source: Ambulatory Visit | Attending: Obstetrics & Gynecology | Admitting: Obstetrics & Gynecology

## 2012-10-07 ENCOUNTER — Inpatient Hospital Stay (HOSPITAL_COMMUNITY): Payer: Self-pay

## 2012-10-07 ENCOUNTER — Ambulatory Visit (HOSPITAL_COMMUNITY): Payer: Self-pay

## 2012-10-07 ENCOUNTER — Ambulatory Visit (INDEPENDENT_AMBULATORY_CARE_PROVIDER_SITE_OTHER): Payer: Self-pay | Admitting: Obstetrics & Gynecology

## 2012-10-07 VITALS — BP 150/93 | Temp 99.4°F | Wt 261.3 lb

## 2012-10-07 DIAGNOSIS — IMO0001 Reserved for inherently not codable concepts without codable children: Secondary | ICD-10-CM

## 2012-10-07 DIAGNOSIS — O24919 Unspecified diabetes mellitus in pregnancy, unspecified trimester: Secondary | ICD-10-CM

## 2012-10-07 DIAGNOSIS — O10019 Pre-existing essential hypertension complicating pregnancy, unspecified trimester: Secondary | ICD-10-CM | POA: Insufficient documentation

## 2012-10-07 DIAGNOSIS — O368390 Maternal care for abnormalities of the fetal heart rate or rhythm, unspecified trimester, not applicable or unspecified: Secondary | ICD-10-CM | POA: Insufficient documentation

## 2012-10-07 DIAGNOSIS — I10 Essential (primary) hypertension: Secondary | ICD-10-CM

## 2012-10-07 DIAGNOSIS — O169 Unspecified maternal hypertension, unspecified trimester: Secondary | ICD-10-CM

## 2012-10-07 DIAGNOSIS — O99810 Abnormal glucose complicating pregnancy: Secondary | ICD-10-CM | POA: Insufficient documentation

## 2012-10-07 LAB — POCT URINALYSIS DIP (DEVICE)
Ketones, ur: 40 mg/dL — AB
Protein, ur: 30 mg/dL — AB
Specific Gravity, Urine: 1.025 (ref 1.005–1.030)
pH: 6 (ref 5.0–8.0)

## 2012-10-07 NOTE — MAU Note (Signed)
Pt sent from clinic for further monitoring, after a variable deceleration was noted on her NST.  Reports good fetal movement.  Denies any LOF or bleeding.

## 2012-10-07 NOTE — MAU Provider Note (Signed)
History  Ms. Yeoman is a 32 y.o. [id number] at [redacted]w[redacted]d who presents to the MAU due to a suspicious variable decel seen during a routine prenatal visit.  History this pregnancy includes GDM for which she is on Glyburide and PIH with chronic hypertension Pt has been having mild and irregular contractions for approximately 3 weeks, though she has noted increasing intensity in the last few days, however she does not note any regularity.  She has not noted any bleeding or loss of fluid, and continues to feel good fetal movement.   CSN: [id number]  Arrival date and time: 10/07/12 1226   None     Chief Complaint  Patient presents with  . Fetal monitoring    HPI  OB History    Grav Para Term Preterm Abortions TAB SAB Ect Mult Living   4 3 1 2      3       Past Medical History  Diagnosis Date  . Hypertension   . Anemia   . Heart murmur   . Morbid obesity   . Fibroid   . Diabetes mellitus without complication   . History of preterm delivery, currently pregnant 08/12/2012    Past Surgical History  Procedure Date  . Vaginal delivery     x 3    Family History  Problem Relation Age of Onset  . Other Neg Hx     History  Substance Use Topics  . Smoking status: Never Smoker   . Smokeless tobacco: Never Used  . Alcohol Use: No    Allergies:  Allergies  Allergen Reactions  . Kiwi Extract Nausea Only  . Latex Itching    Prescriptions prior to admission  Medication Sig Dispense Refill  . acetaminophen (TYLENOL) 500 MG tablet Take 500 mg by mouth every 6 (six) hours as needed. pain      . calcium carbonate (OS-CAL) 600 MG TABS Take 300 mg by mouth daily.      Marland Kitchen glyBURIDE (DIABETA) 5 MG tablet Take 10 mg by mouth 2 (two) times daily with a meal.      . labetalol (NORMODYNE) 200 MG tablet Take 2 tablets (400 mg total) by mouth 2 (two) times daily.  60 tablet  2  . omeprazole (PRILOSEC) 20 MG capsule Take 20 mg by mouth daily as needed. For nausea or heartburn      . Prenatal  Vit-Fe Fumarate-FA (PRENATAL MULTIVITAMIN) TABS Take 1 tablet by mouth at bedtime.         Review of Systems  Constitutional: Negative for fever.  HENT: Positive for congestion.   Eyes: Negative for blurred vision.  Respiratory: Negative for cough.   Cardiovascular: Negative for chest pain and palpitations.  Gastrointestinal: Negative for heartburn, nausea, vomiting, abdominal pain, diarrhea and constipation.  Genitourinary: Negative for dysuria and flank pain.  Neurological: Negative for dizziness, tingling and headaches.   Physical Exam   Blood pressure 157/83, pulse 100, last menstrual period 02/05/2012.  Physical Exam  Constitutional: She is oriented to person, place, and time. She appears well-developed and well-nourished. No distress.  HENT:  Head: Normocephalic and atraumatic.  Eyes: Pupils are equal, round, and reactive to light. Right eye exhibits no discharge.  Neck: Normal range of motion. Neck supple.  Cardiovascular: Normal rate, regular rhythm and intact distal pulses.   Murmur heard. Respiratory: Effort normal and breath sounds normal. No respiratory distress. She has no wheezes.  GI: Soft. Bowel sounds are normal. There is no tenderness.  gravid  Musculoskeletal: Normal range of motion. Edema: trace ankle edema.  Neurological: She is alert and oriented to person, place, and time. No cranial nerve deficit.  Skin: Skin is warm and dry.  Psychiatric: She has a normal mood and affect.   FHR 145, no accels, no decels Toco: occasional contractions  Dilation: Fingertip Effacement (%): Thick Cervical Position: Middle Station: -3 Presentation: Undeterminable Exam by:: Rosina Lowenstein, RN   MAU Course  Procedures  Results for orders placed in visit on 10/07/12 (from the past 24 hour(s))  POCT URINALYSIS DIP (DEVICE)     Status: Abnormal   Collection Time   10/07/12 10:14 AM      Component Value Range   Glucose, UA NEGATIVE  NEGATIVE mg/dL   Bilirubin Urine  NEGATIVE  NEGATIVE   Ketones, ur 40 (*) NEGATIVE mg/dL   Specific Gravity, Urine 1.025  1.005 - 1.030   Hgb urine dipstick MODERATE (*) NEGATIVE   pH 6.0  5.0 - 8.0   Protein, ur 30 (*) NEGATIVE mg/dL   Urobilinogen, UA 0.2  0.0 - 1.0 mg/dL   Nitrite NEGATIVE  NEGATIVE   Leukocytes, UA NEGATIVE  NEGATIVE   BPP: 8/8  Assessment and Plan  32 y.o. [id number] at [redacted]w[redacted]d who presented for extended monitoring due to a suspicious variable decel noted during a routine prenatal visit today.  Fetal monitoring here lacked accelerations, so she was sent for a BPP, which was reassuring at 8/8.  Discharge home with instructions regarding when to return to MAU and to follow up as previously scheduled at high risk clinic.   Bonnita Nasuti 10/07/2012, 1:46 PM   Seen and agree with note Wynelle Bourgeois CNM

## 2012-10-07 NOTE — Patient Instructions (Signed)
Home Tests Home testing is a growing area of health care that places new responsibilities on the health care consumer. If you need additional information or would like to learn about recently approved tests, visit the FDA web page Over-the-Counter In Vitro Diagnostic Devices. Another FDA page, Currently Waived Analytes, includes links to a broad range of tests kits and devices that are identified by brand. Not all are for home use -- those that are will be clearly marked. The following home testing kits are available.  KEY: This key applies to all of the tests listed below  SA=stand-alone test, results available to consumer in the home  OTC=over-the-counter, no prescription needed.  N/A= not applicable Glucose  Condition: Diabetes  Purpose: To monitor blood sugar levels  Format: SA  Availability: OTC, physician recommendation Comments: Home testing for diabetes has advanced from urine testing to whole blood testing. Less invasive or painful procedures, such as alternate site testing meters and those that use infrared technology to "read" blood sugar through the skin, are becoming more common. In 2001, the FDA approved a watch-like device for testing blood sugar.  Cholesterol  Condition: Heart disease  Purpose: Screening for total cholesterol in blood  Format: SA  Availability: OTC Comments: Does not separate out cholesterol. Natural variations in cholesterol may make test results hard to interpret. If results indicate potential heart disease, see your doctor.  Luteinizing Hormone   Condition: Ovulation  Purpose: Predicting time when woman is most likely to be fertile  Format: SA  Availability: OTC Comments: Certain monitoring devices can store information and accessed by the couple and their health provider for retrospective review.  Human Chorionic Gonadotropin (hCG)   Condition: Pregnancy  Purpose: Screening for possibility of pregnancy  Format: SA  Availability:  OTC Comments: Results should be confirmed by doctor or laboratory testing  Fecal Occult Blood   Condition: Colo-rectal cancer  Purpose: Screening for blood in the stool  Format: SA  Availability: OTC Comments: Subject to false positives; substances in the body or in your system can affect results.  Prothrombin Time (Coagulation Testing)   Condition: Heart disease, stroke, atherosclerosis  Purpose: Measuring the concentration of blood thinner in the body  Format: SA; portable, battery-operated device.  Availability: By prescription Comments: Relatively new; safety and efficacy are still being evaluated. Requires considerable patient training and compliance to be useful. Cost covered by some insurers.  Drugs of Abuse   Condition: Possible current or past illicit drug use that results in organ systems damage  Purpose: Screens for the possibility that the consumer takes illegal or restricted drugs of abuse  Format: SA; In commercial employer situations, positive results are then referred to laboratories for confirmation.  Availability: OTC Comments: Results could be affected by medications or foods, such as poppy seeds.  HIV Antibody   Condition: Forerunner of AIDS; susceptibility to other sexually transmitted diseases (STD)  Purpose: Screens for infection by HIV, the virus that causes AIDS  Format: Kit; sample sent to lab for analysis.  Availability: OTC Comments: Results available by phone, which protects anonymity. Counseling is required if test is positive and recommended if test is negative.  Hepatitis C   Condition: Liver dysfunction  Purpose: Screens for past or current infection by Hepatitis C.  Format: Kit; sample sent to lab for analysis. Lab runs same tests used by doctors and hospitals to determine whether antibodies to Hepatitis C are present.  Availability: OTC Comments: Like HIV, combines telephone registration/pretest counseling, collection of blood  sample,  shipping, laboratory testing, telephone results retrieval, and post-test counseling.  Other Home Tests   Condition: Various, such as osteoporosis, hormone levels, STD's  Purpose: Screening  Format: N/A. Varies.  Availability: N/A. Some tests may be available internationally but are not yet approved in the [place] Comments: The FDA continuously updates its list of tests approved and cleared for marketing. For example, recent approvals include a monitoring test for patients who have been diagnosed for bladder cancer, a screening test for blood in urine, etc. Document Released: 09/30/2004 Document Revised: 11/20/2011 Document Reviewed: 08/28/2005 [facility] Patient Information 2013 [place], [place].

## 2012-10-07 NOTE — Progress Notes (Signed)
Patient is not feeling well today.  She has been sneezing a lot and has a stuffy nose.

## 2012-10-07 NOTE — Progress Notes (Signed)
NST reactive but suspicious variable decel seen and she needs prolonged monitoring in MAU. BS is in range, continue present dose of glyburide. May use Afrin nasal spray short course

## 2012-10-10 ENCOUNTER — Other Ambulatory Visit: Payer: Self-pay

## 2012-10-11 ENCOUNTER — Ambulatory Visit (HOSPITAL_COMMUNITY)
Admit: 2012-10-11 | Discharge: 2012-10-11 | Disposition: A | Discharge status: Home / Self Care | Payer: Self-pay | Attending: Obstetrics & Gynecology | Admitting: Obstetrics & Gynecology

## 2012-10-11 ENCOUNTER — Other Ambulatory Visit: Payer: Self-pay | Admitting: Obstetrics & Gynecology

## 2012-10-11 ENCOUNTER — Ambulatory Visit (HOSPITAL_COMMUNITY)
Admission: RE | Admit: 2012-10-11 | Discharge: 2012-10-11 | Disposition: A | Discharge status: Home / Self Care | Payer: Self-pay | Source: Ambulatory Visit | Attending: Obstetrics & Gynecology | Admitting: Obstetrics & Gynecology

## 2012-10-11 DIAGNOSIS — O0990 Supervision of high risk pregnancy, unspecified, unspecified trimester: Secondary | ICD-10-CM

## 2012-10-11 DIAGNOSIS — O10919 Unspecified pre-existing hypertension complicating pregnancy, unspecified trimester: Secondary | ICD-10-CM

## 2012-10-11 DIAGNOSIS — O24919 Unspecified diabetes mellitus in pregnancy, unspecified trimester: Secondary | ICD-10-CM

## 2012-10-11 DIAGNOSIS — O288 Other abnormal findings on antenatal screening of mother: Secondary | ICD-10-CM

## 2012-10-11 DIAGNOSIS — O10019 Pre-existing essential hypertension complicating pregnancy, unspecified trimester: Secondary | ICD-10-CM | POA: Insufficient documentation

## 2012-10-11 DIAGNOSIS — O99810 Abnormal glucose complicating pregnancy: Secondary | ICD-10-CM | POA: Insufficient documentation

## 2012-10-11 DIAGNOSIS — O169 Unspecified maternal hypertension, unspecified trimester: Secondary | ICD-10-CM

## 2012-10-11 DIAGNOSIS — IMO0001 Reserved for inherently not codable concepts without codable children: Secondary | ICD-10-CM

## 2012-10-11 DIAGNOSIS — E669 Obesity, unspecified: Secondary | ICD-10-CM | POA: Insufficient documentation

## 2012-10-11 DIAGNOSIS — Z8751 Personal history of pre-term labor: Secondary | ICD-10-CM | POA: Insufficient documentation

## 2012-10-11 DIAGNOSIS — O99210 Obesity complicating pregnancy, unspecified trimester: Secondary | ICD-10-CM | POA: Insufficient documentation

## 2012-10-11 DIAGNOSIS — O09299 Supervision of pregnancy with other poor reproductive or obstetric history, unspecified trimester: Secondary | ICD-10-CM

## 2012-10-14 ENCOUNTER — Ambulatory Visit (HOSPITAL_COMMUNITY)
Admission: RE | Admit: 2012-10-14 | Discharge: 2012-10-14 | Disposition: A | Discharge status: Home / Self Care | Payer: Self-pay | Source: Ambulatory Visit | Attending: Obstetrics and Gynecology | Admitting: Obstetrics and Gynecology

## 2012-10-14 ENCOUNTER — Ambulatory Visit (INDEPENDENT_AMBULATORY_CARE_PROVIDER_SITE_OTHER): Payer: Self-pay | Admitting: Obstetrics and Gynecology

## 2012-10-14 VITALS — BP 138/87 | Temp 100.2°F | Wt 259.8 lb

## 2012-10-14 DIAGNOSIS — O99210 Obesity complicating pregnancy, unspecified trimester: Secondary | ICD-10-CM | POA: Insufficient documentation

## 2012-10-14 DIAGNOSIS — O289 Unspecified abnormal findings on antenatal screening of mother: Secondary | ICD-10-CM | POA: Insufficient documentation

## 2012-10-14 DIAGNOSIS — O9989 Other specified diseases and conditions complicating pregnancy, childbirth and the puerperium: Secondary | ICD-10-CM

## 2012-10-14 DIAGNOSIS — O288 Other abnormal findings on antenatal screening of mother: Secondary | ICD-10-CM

## 2012-10-14 DIAGNOSIS — O24919 Unspecified diabetes mellitus in pregnancy, unspecified trimester: Secondary | ICD-10-CM

## 2012-10-14 DIAGNOSIS — E669 Obesity, unspecified: Secondary | ICD-10-CM

## 2012-10-14 DIAGNOSIS — O99810 Abnormal glucose complicating pregnancy: Secondary | ICD-10-CM | POA: Insufficient documentation

## 2012-10-14 DIAGNOSIS — O09219 Supervision of pregnancy with history of pre-term labor, unspecified trimester: Secondary | ICD-10-CM

## 2012-10-14 DIAGNOSIS — O10019 Pre-existing essential hypertension complicating pregnancy, unspecified trimester: Secondary | ICD-10-CM | POA: Insufficient documentation

## 2012-10-14 DIAGNOSIS — IMO0001 Reserved for inherently not codable concepts without codable children: Secondary | ICD-10-CM

## 2012-10-14 DIAGNOSIS — O09299 Supervision of pregnancy with other poor reproductive or obstetric history, unspecified trimester: Secondary | ICD-10-CM

## 2012-10-14 DIAGNOSIS — O10919 Unspecified pre-existing hypertension complicating pregnancy, unspecified trimester: Secondary | ICD-10-CM

## 2012-10-14 DIAGNOSIS — Z8751 Personal history of pre-term labor: Secondary | ICD-10-CM | POA: Insufficient documentation

## 2012-10-14 LAB — POCT URINALYSIS DIP (DEVICE)
Glucose, UA: NEGATIVE mg/dL
Ketones, ur: 80 mg/dL — AB
Leukocytes, UA: NEGATIVE
Protein, ur: NEGATIVE mg/dL
Urobilinogen, UA: 0.2 mg/dL (ref 0.0–1.0)

## 2012-10-14 MED ORDER — LABETALOL HCL 100 MG PO TABS: 400.0000 mg | ORAL_TABLET | Freq: Two times a day (BID) | ORAL | Status: DC

## 2012-10-14 NOTE — Progress Notes (Signed)
NST reviewed category II- will order BPP. Patient complaining of heart burn. Advised to take Prilosec daily as opposed to prn. Patient has not been taking labetalol 400 mg BID. States Rx increased to $40. Will change the prescription to 100 mg tablet and advise patient to take 4 tablets twice a day. Informed patient to contact the clinic if she is still having issues obtaining prescription. Patient did not bring CBG lob but reports CBG well controlled Patient complaining of contractions: cx-1/thick/high

## 2012-10-14 NOTE — Progress Notes (Signed)
Pulse-  114  Edema- face  Pressure- lower abd/vaginal  Pain-lower back  Pt c/o "burning in the stomach, when I tried to eat this am it hurt"

## 2012-10-15 NOTE — MAU Provider Note (Signed)
Attestation of Attending Supervision of Advanced Practitioner (CNM/NP): Evaluation and management procedures were performed by the Advanced Practitioner under my supervision and collaboration. I have reviewed the Advanced Practitioner's note and chart, and I agree with the management and plan.  Cyniah Gossard H. 9:12 PM   

## 2012-10-16 ENCOUNTER — Other Ambulatory Visit: Payer: Self-pay | Admitting: Family Medicine

## 2012-10-16 DIAGNOSIS — O169 Unspecified maternal hypertension, unspecified trimester: Secondary | ICD-10-CM

## 2012-10-18 ENCOUNTER — Encounter (HOSPITAL_COMMUNITY): Payer: Self-pay

## 2012-10-18 ENCOUNTER — Ambulatory Visit (HOSPITAL_COMMUNITY)
Admit: 2012-10-18 | Discharge: 2012-10-18 | Disposition: A | Discharge status: Home / Self Care | Payer: Self-pay | Attending: Family Medicine | Admitting: Family Medicine

## 2012-10-18 ENCOUNTER — Telehealth: Payer: Self-pay | Admitting: *Deleted

## 2012-10-18 VITALS — BP 139/82 | HR 104 | Wt 262.0 lb

## 2012-10-18 DIAGNOSIS — IMO0001 Reserved for inherently not codable concepts without codable children: Secondary | ICD-10-CM

## 2012-10-18 DIAGNOSIS — O10919 Unspecified pre-existing hypertension complicating pregnancy, unspecified trimester: Secondary | ICD-10-CM

## 2012-10-18 DIAGNOSIS — O09299 Supervision of pregnancy with other poor reproductive or obstetric history, unspecified trimester: Secondary | ICD-10-CM

## 2012-10-18 DIAGNOSIS — O10019 Pre-existing essential hypertension complicating pregnancy, unspecified trimester: Secondary | ICD-10-CM | POA: Insufficient documentation

## 2012-10-18 DIAGNOSIS — O24919 Unspecified diabetes mellitus in pregnancy, unspecified trimester: Secondary | ICD-10-CM

## 2012-10-18 DIAGNOSIS — E669 Obesity, unspecified: Secondary | ICD-10-CM | POA: Insufficient documentation

## 2012-10-18 DIAGNOSIS — O0990 Supervision of high risk pregnancy, unspecified, unspecified trimester: Secondary | ICD-10-CM

## 2012-10-18 DIAGNOSIS — O169 Unspecified maternal hypertension, unspecified trimester: Secondary | ICD-10-CM

## 2012-10-18 DIAGNOSIS — O99810 Abnormal glucose complicating pregnancy: Secondary | ICD-10-CM | POA: Insufficient documentation

## 2012-10-18 DIAGNOSIS — Z8751 Personal history of pre-term labor: Secondary | ICD-10-CM | POA: Insufficient documentation

## 2012-10-18 MED ORDER — LABETALOL HCL 100 MG PO TABS: 400.0000 mg | ORAL_TABLET | Freq: Two times a day (BID) | ORAL | Status: DC

## 2012-10-18 NOTE — Telephone Encounter (Signed)
Patient came by clinic and requested a written rx for her labetalol because she wants to take it to a different pharmacy to see if its more affordable. Advised patient that we would get her an rx.

## 2012-10-18 NOTE — Progress Notes (Signed)
Latasha Simmons  was seen today for an ultrasound appointment.  See full report in [facility].  Impression: Single IUP at 35 3/7 weeks Limited ultrasound for AFI only Normal amniotic fluid volume (AFI=10.6 cm) Reactive NST - normal modified BPP  Recommendations: Continue 2x weekly NSTs with weekly AFIs. Follow up growth scan in 3 weeks.  Alpha Gula, MD

## 2012-10-18 NOTE — Telephone Encounter (Signed)
Raynelle Fanning gave approval for a printed rx.

## 2012-10-21 ENCOUNTER — Ambulatory Visit (INDEPENDENT_AMBULATORY_CARE_PROVIDER_SITE_OTHER): Payer: Self-pay | Admitting: Family Medicine

## 2012-10-21 ENCOUNTER — Other Ambulatory Visit: Payer: Self-pay | Admitting: Family Medicine

## 2012-10-21 ENCOUNTER — Ambulatory Visit (HOSPITAL_COMMUNITY)
Admission: RE | Admit: 2012-10-21 | Discharge: 2012-10-21 | Disposition: A | Discharge status: Home / Self Care | Payer: Self-pay | Source: Ambulatory Visit | Attending: Family Medicine | Admitting: Family Medicine

## 2012-10-21 ENCOUNTER — Encounter: Payer: Self-pay | Admitting: Family Medicine

## 2012-10-21 VITALS — BP 148/88 | Wt 260.3 lb

## 2012-10-21 DIAGNOSIS — O10019 Pre-existing essential hypertension complicating pregnancy, unspecified trimester: Secondary | ICD-10-CM | POA: Insufficient documentation

## 2012-10-21 DIAGNOSIS — O10919 Unspecified pre-existing hypertension complicating pregnancy, unspecified trimester: Secondary | ICD-10-CM

## 2012-10-21 DIAGNOSIS — IMO0001 Reserved for inherently not codable concepts without codable children: Secondary | ICD-10-CM

## 2012-10-21 DIAGNOSIS — O24919 Unspecified diabetes mellitus in pregnancy, unspecified trimester: Secondary | ICD-10-CM

## 2012-10-21 DIAGNOSIS — O99810 Abnormal glucose complicating pregnancy: Secondary | ICD-10-CM | POA: Insufficient documentation

## 2012-10-21 DIAGNOSIS — O288 Other abnormal findings on antenatal screening of mother: Secondary | ICD-10-CM | POA: Insufficient documentation

## 2012-10-21 DIAGNOSIS — E669 Obesity, unspecified: Secondary | ICD-10-CM

## 2012-10-21 DIAGNOSIS — O99210 Obesity complicating pregnancy, unspecified trimester: Secondary | ICD-10-CM | POA: Insufficient documentation

## 2012-10-21 DIAGNOSIS — O289 Unspecified abnormal findings on antenatal screening of mother: Secondary | ICD-10-CM

## 2012-10-21 DIAGNOSIS — O9989 Other specified diseases and conditions complicating pregnancy, childbirth and the puerperium: Secondary | ICD-10-CM

## 2012-10-21 DIAGNOSIS — O09219 Supervision of pregnancy with history of pre-term labor, unspecified trimester: Secondary | ICD-10-CM

## 2012-10-21 LAB — OB RESULTS CONSOLE GBS: GBS: NEGATIVE

## 2012-10-21 LAB — POCT URINALYSIS DIP (DEVICE)
Bilirubin Urine: NEGATIVE
Glucose, UA: NEGATIVE mg/dL
Nitrite: NEGATIVE
Specific Gravity, Urine: 1.025 (ref 1.005–1.030)
Urobilinogen, UA: 1 mg/dL (ref 0.0–1.0)

## 2012-10-21 NOTE — Patient Instructions (Signed)
Breastfeeding Deciding to breastfeed is one of the best choices you can make for you and your baby. The information that follows gives a brief overview of the benefits of breastfeeding as well as common topics surrounding breastfeeding. BENEFITS OF BREASTFEEDING For the baby  The first milk (colostrum) helps the baby's digestive system function better.   There are antibodies in the mother's milk that help the baby fight off infections.   The baby has a lower incidence of asthma, allergies, and sudden infant death syndrome (SIDS).   The nutrients in breast milk are better for the baby than infant formulas, and breast milk helps the baby's brain grow better.   Babies who breastfeed have less gas, colic, and constipation.  For the mother  Breastfeeding helps develop a very special bond between the mother and her baby.   Breastfeeding is convenient, always available at the correct temperature, and costs nothing.   Breastfeeding burns calories in the mother and helps her lose weight that was gained during pregnancy.   Breastfeeding makes the uterus contract back down to normal size faster and slows bleeding following delivery.   Breastfeeding mothers have a lower risk of developing breast cancer.  BREASTFEEDING FREQUENCY  A healthy, full-term baby may breastfeed as often as every hour or space his or her feedings to every 3 hours.   Watch your baby for signs of hunger. Nurse your baby if he or she shows signs of hunger. How often you nurse will vary from baby to baby.   Nurse as often as the baby requests, or when you feel the need to reduce the fullness of your breasts.   Awaken the baby if it has been 3 4 hours since the last feeding.   Frequent feeding will help the mother make more milk and will help prevent problems, such as sore nipples and engorgement of the breasts.  BABY'S POSITION AT THE BREAST  Whether lying down or sitting, be sure that the baby's tummy is  facing your tummy.   Support the breast with 4 fingers underneath the breast and the thumb above. Make sure your fingers are well away from the nipple and baby's mouth.   Stroke the baby's lips gently with your finger or nipple.   When the baby's mouth is open wide enough, place all of your nipple and as much of the areola as possible into your baby's mouth.   Pull the baby in close so the tip of the nose and the baby's cheeks touch the breast during the feeding.  FEEDINGS AND SUCTION  The length of each feeding varies from baby to baby and from feeding to feeding.   The baby must suck about 2 3 minutes for your milk to get to him or her. This is called a "let down." For this reason, allow the baby to feed on each breast as long as he or she wants. Your baby will end the feeding when he or she has received the right balance of nutrients.   To break the suction, put your finger into the corner of the baby's mouth and slide it between his or her gums before removing your breast from his or her mouth. This will help prevent sore nipples.  HOW TO TELL WHETHER YOUR BABY IS GETTING ENOUGH BREAST MILK. Wondering whether or not your baby is getting enough milk is a common concern among mothers. You can be assured that your baby is getting enough milk if:   Your baby is actively   sucking and you hear swallowing.   Your baby seems relaxed and satisfied after a feeding.   Your baby nurses at least 8 12 times in a 24 hour time period. Nurse your baby until he or she unlatches or falls asleep at the first breast (at least 10 20 minutes), then offer the second side.   Your baby is wetting 5 6 disposable diapers (6 8 cloth diapers) in a 24 hour period by 5 6 days of age.   Your baby is having at least 3 4 stools every 24 hours for the first 6 weeks. The stool should be soft and yellow.   Your baby should gain 4 7 ounces per week after he or she is 4 days old.   Your breasts feel softer  after nursing.  REDUCING BREAST ENGORGEMENT  In the first week after your baby is born, you may experience signs of breast engorgement. When breasts are engorged, they feel heavy, warm, full, and may be tender to the touch. You can reduce engorgement if you:   Nurse frequently, every 2 3 hours. Mothers who breastfeed early and often have fewer problems with engorgement.   Place light ice packs on your breasts for 10 20 minutes between feedings. This reduces swelling. Wrap the ice packs in a lightweight towel to protect your skin. Bags of frozen vegetables work well for this purpose.   Take a warm shower or apply warm, moist heat to your breast for 5 10 minutes just before each feeding. This increases circulation and helps the milk flow.   Gently massage your breast before and during the feeding. Using your finger tips, massage from the chest wall towards your nipple in a circular motion.   Make sure that the baby empties at least one breast at every feeding before switching sides.   Use a breast pump to empty the breasts if your baby is sleepy or not nursing well. You may also want to pump if you are returning to work oryou feel you are getting engorged.   Avoid bottle feeds, pacifiers, or supplemental feedings of water or juice in place of breastfeeding. Breast milk is all the food your baby needs. It is not necessary for your baby to have water or formula. In fact, to help your breasts make more milk, it is best not to give your baby supplemental feedings during the early weeks.   Be sure the baby is latched on and positioned properly while breastfeeding.   Wear a supportive bra, avoiding underwire styles.   Eat a balanced diet with enough fluids.   Rest often, relax, and take your prenatal vitamins to prevent fatigue, stress, and anemia.  If you follow these suggestions, your engorgement should improve in 24 48 hours. If you are still experiencing difficulty, call your  lactation consultant or caregiver.  CARING FOR YOURSELF Take care of your breasts  Bathe or shower daily.   Avoid using soap on your nipples.   Start feedings on your left breast at one feeding and on your right breast at the next feeding.   You will notice an increase in your milk supply 2 5 days after delivery. You may feel some discomfort from engorgement, which makes your breasts very firm and often tender. Engorgement "peaks" out within 24 48 hours. In the meantime, apply warm moist towels to your breasts for 5 10 minutes before feeding. Gentle massage and expression of some milk before feeding will soften your breasts, making it easier for your   baby to latch on.   Wear a well-fitting nursing bra, and air dry your nipples for a 3 4minutes after each feeding.   Only use cotton bra pads.   Only use pure lanolin on your nipples after nursing. You do not need to wash it off before feeding the baby again. Another option is to express a few drops of breast milk and gently massage it into your nipples.  Take care of yourself  Eat well-balanced meals and nutritious snacks.   Drinking milk, fruit juice, and water to satisfy your thirst (about 8 glasses a day).   Get plenty of rest.  Avoid foods that you notice affect the baby in a bad way.  SEEK MEDICAL CARE IF:   You have difficulty with breastfeeding and need help.   You have a hard, red, sore area on your breast that is accompanied by a fever.   Your baby is too sleepy to eat well or is having trouble sleeping.   Your baby is wetting less than 6 diapers a day, by 5 days of age.   Your baby's skin or white part of his or her eyes is more yellow than it was in the hospital.   You feel depressed.  Document Released: 08/28/2005 Document Revised: 02/27/2012 Document Reviewed: 11/26/2011 [employer] Patient Information 2013 [employer].  

## 2012-10-21 NOTE — Progress Notes (Signed)
BP up today NST with min. Variability and non-reactive, no decels.--will order BPP today Cultures today, labs today. BPP 8/8 in U/s today.

## 2012-10-21 NOTE — Progress Notes (Signed)
P = 102   Pt denies H/A or visual disturbances

## 2012-10-21 NOTE — Progress Notes (Signed)
Patient sent to U/S to be worked in for [facility].

## 2012-10-21 NOTE — Addendum Note (Signed)
Addended by: Jill Side on: 10/21/2012 05:10 PM   Modules accepted: Orders

## 2012-10-24 ENCOUNTER — Other Ambulatory Visit: Payer: Self-pay

## 2012-10-28 ENCOUNTER — Ambulatory Visit (INDEPENDENT_AMBULATORY_CARE_PROVIDER_SITE_OTHER): Payer: Self-pay | Admitting: Obstetrics & Gynecology

## 2012-10-28 ENCOUNTER — Encounter: Payer: Self-pay | Admitting: Family Medicine

## 2012-10-28 VITALS — BP 148/98 | Wt 260.1 lb

## 2012-10-28 DIAGNOSIS — O09219 Supervision of pregnancy with history of pre-term labor, unspecified trimester: Secondary | ICD-10-CM

## 2012-10-28 DIAGNOSIS — O24919 Unspecified diabetes mellitus in pregnancy, unspecified trimester: Secondary | ICD-10-CM

## 2012-10-28 DIAGNOSIS — E669 Obesity, unspecified: Secondary | ICD-10-CM

## 2012-10-28 DIAGNOSIS — O10919 Unspecified pre-existing hypertension complicating pregnancy, unspecified trimester: Secondary | ICD-10-CM

## 2012-10-28 DIAGNOSIS — O10019 Pre-existing essential hypertension complicating pregnancy, unspecified trimester: Secondary | ICD-10-CM

## 2012-10-28 DIAGNOSIS — O99210 Obesity complicating pregnancy, unspecified trimester: Secondary | ICD-10-CM

## 2012-10-28 DIAGNOSIS — O9989 Other specified diseases and conditions complicating pregnancy, childbirth and the puerperium: Secondary | ICD-10-CM

## 2012-10-28 DIAGNOSIS — IMO0001 Reserved for inherently not codable concepts without codable children: Secondary | ICD-10-CM

## 2012-10-28 LAB — CBC
Hemoglobin: 11.4 g/dL — ABNORMAL LOW (ref 12.0–15.0)
MCH: 22.4 pg — ABNORMAL LOW (ref 26.0–34.0)
MCHC: 32.6 g/dL (ref 30.0–36.0)
Platelets: 279 10*3/uL (ref 150–400)
RDW: 16.1 % — ABNORMAL HIGH (ref 11.5–15.5)

## 2012-10-28 LAB — COMPREHENSIVE METABOLIC PANEL
ALT: 15 U/L (ref 0–35)
AST: 13 U/L (ref 0–37)
Albumin: 3.5 g/dL (ref 3.5–5.2)
Alkaline Phosphatase: 70 U/L (ref 39–117)
Potassium: 4.2 mEq/L (ref 3.5–5.3)
Sodium: 137 mEq/L (ref 135–145)
Total Bilirubin: 0.3 mg/dL (ref 0.3–1.2)
Total Protein: 6.2 g/dL (ref 6.0–8.3)

## 2012-10-28 LAB — POCT URINALYSIS DIP (DEVICE)
Bilirubin Urine: NEGATIVE
Glucose, UA: NEGATIVE mg/dL
Specific Gravity, Urine: 1.02 (ref 1.005–1.030)

## 2012-10-28 MED ORDER — METFORMIN HCL 500 MG PO TABS: 500.0000 mg | ORAL_TABLET | Freq: Two times a day (BID) | ORAL | Status: DC

## 2012-10-28 MED ORDER — GLYBURIDE 5 MG PO TABS: 7.5000 mg | ORAL_TABLET | Freq: Two times a day (BID) | ORAL | Status: DC

## 2012-10-28 NOTE — Progress Notes (Signed)
NST performed today was reviewed and was found to be reactive.  Continue recommended antenatal testing and prenatal care. Will check CBC, CMET and urine Pr/Cr ratio given elevated BP and 1+ proteinuria.  No symptoms of preeclampsia. Increased Glyburide to 7.5 mg bid  from 5 mg bid to get better control, as BS are slightly elevated over the normal range. Will continue to monitor. Growth scan scheduled 10/31/12. No other complaints or concerns.  Fetal movement and labor precautions reviewed.

## 2012-10-28 NOTE — Progress Notes (Signed)
[place] growth @ MFM on 2/20.  IOL scheduled 11/12/12 @ 0630

## 2012-10-28 NOTE — Patient Instructions (Signed)
Return to clinic for any obstetric concerns or go to [facility] for evaluation  

## 2012-10-28 NOTE — Progress Notes (Signed)
Pulse: 86

## 2012-10-29 LAB — PROTEIN / CREATININE RATIO, URINE
Creatinine, Urine: 141.2 mg/dL
Protein Creatinine Ratio: 0.16 — ABNORMAL HIGH (ref <0.15)
Total Protein, Urine: 22 mg/dL

## 2012-10-30 ENCOUNTER — Other Ambulatory Visit: Payer: Self-pay | Admitting: Family Medicine

## 2012-10-30 DIAGNOSIS — O169 Unspecified maternal hypertension, unspecified trimester: Secondary | ICD-10-CM

## 2012-10-31 ENCOUNTER — Ambulatory Visit (HOSPITAL_COMMUNITY)
Admission: RE | Admit: 2012-10-31 | Discharge: 2012-10-31 | Disposition: A | Discharge status: Home / Self Care | Payer: Self-pay | Source: Ambulatory Visit | Attending: Obstetrics & Gynecology | Admitting: Obstetrics & Gynecology

## 2012-10-31 ENCOUNTER — Telehealth (HOSPITAL_COMMUNITY): Payer: Self-pay | Admitting: *Deleted

## 2012-10-31 VITALS — BP 143/91

## 2012-10-31 VITALS — BP 153/93 | HR 95 | Wt 261.5 lb

## 2012-10-31 DIAGNOSIS — O09299 Supervision of pregnancy with other poor reproductive or obstetric history, unspecified trimester: Secondary | ICD-10-CM

## 2012-10-31 DIAGNOSIS — O99810 Abnormal glucose complicating pregnancy: Secondary | ICD-10-CM | POA: Insufficient documentation

## 2012-10-31 DIAGNOSIS — O10019 Pre-existing essential hypertension complicating pregnancy, unspecified trimester: Secondary | ICD-10-CM | POA: Insufficient documentation

## 2012-10-31 DIAGNOSIS — Z8751 Personal history of pre-term labor: Secondary | ICD-10-CM | POA: Insufficient documentation

## 2012-10-31 DIAGNOSIS — O288 Other abnormal findings on antenatal screening of mother: Secondary | ICD-10-CM

## 2012-10-31 DIAGNOSIS — E669 Obesity, unspecified: Secondary | ICD-10-CM | POA: Insufficient documentation

## 2012-10-31 NOTE — Progress Notes (Signed)
Maternal tracing noted at beginning of NST.  Active fetal movement noted.

## 2012-10-31 NOTE — Telephone Encounter (Signed)
Preadmission screen  

## 2012-11-04 ENCOUNTER — Other Ambulatory Visit: Payer: Self-pay | Admitting: Obstetrics & Gynecology

## 2012-11-04 ENCOUNTER — Ambulatory Visit (INDEPENDENT_AMBULATORY_CARE_PROVIDER_SITE_OTHER): Payer: Self-pay | Admitting: Obstetrics & Gynecology

## 2012-11-04 VITALS — BP 149/96 | Temp 97.0°F | Wt 259.0 lb

## 2012-11-04 DIAGNOSIS — O10019 Pre-existing essential hypertension complicating pregnancy, unspecified trimester: Secondary | ICD-10-CM

## 2012-11-04 DIAGNOSIS — O99210 Obesity complicating pregnancy, unspecified trimester: Secondary | ICD-10-CM

## 2012-11-04 DIAGNOSIS — O24919 Unspecified diabetes mellitus in pregnancy, unspecified trimester: Secondary | ICD-10-CM

## 2012-11-04 DIAGNOSIS — O09219 Supervision of pregnancy with history of pre-term labor, unspecified trimester: Secondary | ICD-10-CM

## 2012-11-04 DIAGNOSIS — O9989 Other specified diseases and conditions complicating pregnancy, childbirth and the puerperium: Secondary | ICD-10-CM

## 2012-11-04 DIAGNOSIS — IMO0001 Reserved for inherently not codable concepts without codable children: Secondary | ICD-10-CM

## 2012-11-04 DIAGNOSIS — E669 Obesity, unspecified: Secondary | ICD-10-CM

## 2012-11-04 LAB — CBC
HCT: 34.8 % — ABNORMAL LOW (ref 36.0–46.0)
Hemoglobin: 11.5 g/dL — ABNORMAL LOW (ref 12.0–15.0)
MCHC: 33 g/dL (ref 30.0–36.0)
RBC: 5 MIL/uL (ref 3.87–5.11)

## 2012-11-04 LAB — POCT URINALYSIS DIP (DEVICE)
Ketones, ur: NEGATIVE mg/dL
Leukocytes, UA: NEGATIVE
Nitrite: NEGATIVE
Protein, ur: 30 mg/dL — AB
pH: 6.5 (ref 5.0–8.0)

## 2012-11-04 LAB — COMPREHENSIVE METABOLIC PANEL
Albumin: 3.5 g/dL (ref 3.5–5.2)
Alkaline Phosphatase: 80 U/L (ref 39–117)
BUN: 6 mg/dL (ref 6–23)
CO2: 24 mEq/L (ref 19–32)
Calcium: 9 mg/dL (ref 8.4–10.5)
Glucose, Bld: 98 mg/dL (ref 70–99)
Potassium: 4 mEq/L (ref 3.5–5.3)
Total Protein: 6.1 g/dL (ref 6.0–8.3)

## 2012-11-04 LAB — PROTEIN / CREATININE RATIO, URINE: Protein Creatinine Ratio: 0.14 (ref <0.15)

## 2012-11-04 MED ORDER — LABETALOL HCL 100 MG PO TABS: 300.0000 mg | ORAL_TABLET | Freq: Two times a day (BID) | ORAL | Status: AC

## 2012-11-04 NOTE — Progress Notes (Signed)
Patient states she feels baby moving less lately, about 1-2 times per day for about a week now.

## 2012-11-04 NOTE — Progress Notes (Signed)
Pulse 97 Edema trace in face. C/o headaches X3days.

## 2012-11-04 NOTE — Progress Notes (Signed)
Protein creatinine ratio today.  BP elevated but not taking meds as prescribed.  She is only taking 200 mg bid but prescribed 400 mg bid.  Pt agrees to take 300 mg bid.  Pt has NST on Thursday with AFI.  NST today reactive with early (1 ctx).  Pt's EFW is approx 9 pounds.  Induction scheduled for next Tuesday.  PIH labs today.  Labor signs reviewed.  Preeclampsia signs reviewed.  If labs or urine abnml, will induce.  Pt to come Thursday for NST and AFI

## 2012-11-07 ENCOUNTER — Inpatient Hospital Stay (HOSPITAL_COMMUNITY)
Admission: AD | Admit: 2012-11-07 | Discharge: 2012-11-10 | DRG: 774 | Disposition: A | Discharge status: Home / Self Care | Payer: Medicaid Other | Source: Ambulatory Visit | Attending: Obstetrics & Gynecology | Admitting: Obstetrics & Gynecology

## 2012-11-07 ENCOUNTER — Ambulatory Visit (INDEPENDENT_AMBULATORY_CARE_PROVIDER_SITE_OTHER): Payer: Self-pay | Admitting: General Practice

## 2012-11-07 ENCOUNTER — Ambulatory Visit (HOSPITAL_COMMUNITY): Admission: RE | Admit: 2012-11-07 | Payer: Self-pay | Source: Ambulatory Visit

## 2012-11-07 ENCOUNTER — Observation Stay (HOSPITAL_COMMUNITY): Payer: Medicaid Other

## 2012-11-07 ENCOUNTER — Encounter (HOSPITAL_COMMUNITY): Payer: Self-pay | Admitting: *Deleted

## 2012-11-07 VITALS — BP 150/77 | Wt 259.1 lb

## 2012-11-07 DIAGNOSIS — IMO0001 Reserved for inherently not codable concepts without codable children: Secondary | ICD-10-CM

## 2012-11-07 DIAGNOSIS — O0990 Supervision of high risk pregnancy, unspecified, unspecified trimester: Secondary | ICD-10-CM

## 2012-11-07 DIAGNOSIS — O10913 Unspecified pre-existing hypertension complicating pregnancy, third trimester: Secondary | ICD-10-CM

## 2012-11-07 DIAGNOSIS — IMO0002 Reserved for concepts with insufficient information to code with codable children: Secondary | ICD-10-CM

## 2012-11-07 DIAGNOSIS — I10 Essential (primary) hypertension: Secondary | ICD-10-CM

## 2012-11-07 DIAGNOSIS — O2650 Maternal hypotension syndrome, unspecified trimester: Secondary | ICD-10-CM

## 2012-11-07 DIAGNOSIS — O24913 Unspecified diabetes mellitus in pregnancy, third trimester: Secondary | ICD-10-CM

## 2012-11-07 DIAGNOSIS — O288 Other abnormal findings on antenatal screening of mother: Secondary | ICD-10-CM

## 2012-11-07 DIAGNOSIS — R55 Syncope and collapse: Secondary | ICD-10-CM

## 2012-11-07 DIAGNOSIS — O99814 Abnormal glucose complicating childbirth: Secondary | ICD-10-CM

## 2012-11-07 DIAGNOSIS — O09293 Supervision of pregnancy with other poor reproductive or obstetric history, third trimester: Secondary | ICD-10-CM

## 2012-11-07 DIAGNOSIS — E669 Obesity, unspecified: Secondary | ICD-10-CM | POA: Diagnosis present

## 2012-11-07 HISTORY — DX: Gestational diabetes mellitus in pregnancy, unspecified control: O24.419

## 2012-11-07 LAB — COMPREHENSIVE METABOLIC PANEL
ALT: 18 U/L (ref 0–35)
AST: 18 U/L (ref 0–37)
Albumin: 2.9 g/dL — ABNORMAL LOW (ref 3.5–5.2)
Alkaline Phosphatase: 90 U/L (ref 39–117)
BUN: 6 mg/dL (ref 6–23)
Chloride: 100 mEq/L (ref 96–112)
Potassium: 3.8 mEq/L (ref 3.5–5.1)
Sodium: 135 mEq/L (ref 135–145)
Total Bilirubin: 0.3 mg/dL (ref 0.3–1.2)
Total Protein: 6.5 g/dL (ref 6.0–8.3)

## 2012-11-07 LAB — CBC
HCT: 36.7 % (ref 36.0–46.0)
HCT: 34.6 % — ABNORMAL LOW (ref 36.0–46.0)
Hemoglobin: 11.2 g/dL — ABNORMAL LOW (ref 12.0–15.0)
MCH: 22.8 pg — ABNORMAL LOW (ref 26.0–34.0)
MCH: 23 pg — ABNORMAL LOW (ref 26.0–34.0)
MCHC: 31.9 g/dL (ref 30.0–36.0)
MCV: 71.4 fL — ABNORMAL LOW (ref 78.0–100.0)
MCV: 70.9 fL — ABNORMAL LOW (ref 78.0–100.0)
Platelets: 265 10*3/uL (ref 150–400)
RBC: 4.88 MIL/uL (ref 3.87–5.11)
RDW: 15.6 % — ABNORMAL HIGH (ref 11.5–15.5)
WBC: 8 10*3/uL (ref 4.0–10.5)

## 2012-11-07 LAB — URINALYSIS, ROUTINE W REFLEX MICROSCOPIC
Bilirubin Urine: NEGATIVE
Glucose, UA: NEGATIVE mg/dL
Ketones, ur: 15 mg/dL — AB
Leukocytes, UA: NEGATIVE
Protein, ur: NEGATIVE mg/dL
pH: 6 (ref 5.0–8.0)

## 2012-11-07 LAB — URINE MICROSCOPIC-ADD ON

## 2012-11-07 LAB — PROTEIN / CREATININE RATIO, URINE: Protein Creatinine Ratio: 0.19 — ABNORMAL HIGH (ref 0.00–0.15)

## 2012-11-07 LAB — GLUCOSE, CAPILLARY: Glucose-Capillary: 80 mg/dL (ref 70–99)

## 2012-11-07 MED ORDER — GLYBURIDE 5 MG PO TABS
10.0000 mg | ORAL_TABLET | Freq: Two times a day (BID) | ORAL | Status: DC
  Administered 2012-11-07: 10 mg via ORAL
  Filled 2012-11-07 (×2): qty 2

## 2012-11-07 MED ORDER — SODIUM CHLORIDE 0.9 % IV BOLUS (SEPSIS)
500.0000 mL | Freq: Once | INTRAVENOUS | Status: AC
  Administered 2012-11-07: 500 mL via INTRAVENOUS

## 2012-11-07 MED ORDER — SODIUM CHLORIDE 0.9 % IV SOLN: 250.0000 mL | INTRAVENOUS | Status: DC | PRN

## 2012-11-07 MED ORDER — PRENATAL MULTIVITAMIN CH: 1.0000 | ORAL_TABLET | Freq: Every day | ORAL | Status: DC

## 2012-11-07 MED ORDER — ACETAMINOPHEN 325 MG PO TABS
650.0000 mg | ORAL_TABLET | ORAL | Status: DC | PRN
  Administered 2012-11-08: 650 mg via ORAL

## 2012-11-07 MED ORDER — TERBUTALINE SULFATE 1 MG/ML IJ SOLN: 0.2500 mg | Freq: Once | INTRAMUSCULAR | Status: AC | PRN

## 2012-11-07 MED ORDER — PRENATAL MULTIVITAMIN CH
1.0000 | ORAL_TABLET | Freq: Every day | ORAL | Status: DC
  Administered 2012-11-07: 1 via ORAL
  Filled 2012-11-07: qty 1

## 2012-11-07 MED ORDER — MAGNESIUM SULFATE BOLUS VIA INFUSION
4.0000 g | Freq: Once | INTRAVENOUS | Status: AC
  Administered 2012-11-07: 4 g via INTRAVENOUS
  Filled 2012-11-07: qty 500

## 2012-11-07 MED ORDER — LABETALOL HCL 300 MG PO TABS
300.0000 mg | ORAL_TABLET | Freq: Two times a day (BID) | ORAL | Status: DC
  Administered 2012-11-07 – 2012-11-10 (×6): 300 mg via ORAL
  Filled 2012-11-07 (×10): qty 1

## 2012-11-07 MED ORDER — PANTOPRAZOLE SODIUM 40 MG PO TBEC
40.0000 mg | DELAYED_RELEASE_TABLET | Freq: Every day | ORAL | Status: DC
  Administered 2012-11-07: 40 mg via ORAL
  Filled 2012-11-07 (×3): qty 1

## 2012-11-07 MED ORDER — ZOLPIDEM TARTRATE 5 MG PO TABS
5.0000 mg | ORAL_TABLET | Freq: Every evening | ORAL | Status: DC | PRN
  Administered 2012-11-07: 5 mg via ORAL
  Filled 2012-11-07: qty 1

## 2012-11-07 MED ORDER — MISOPROSTOL 25 MCG QUARTER TABLET
25.0000 ug | ORAL_TABLET | ORAL | Status: DC | PRN
  Administered 2012-11-07 – 2012-11-08 (×2): 25 ug via VAGINAL
  Filled 2012-11-07 (×2): qty 0.25

## 2012-11-07 MED ORDER — SODIUM CHLORIDE 0.9 % IJ SOLN: 3.0000 mL | INTRAMUSCULAR | Status: DC | PRN

## 2012-11-07 MED ORDER — SODIUM CHLORIDE 0.9 % IJ SOLN: 3.0000 mL | Freq: Two times a day (BID) | INTRAMUSCULAR | Status: DC

## 2012-11-07 MED ORDER — CALCIUM CARBONATE ANTACID 500 MG PO CHEW: 2.0000 | CHEWABLE_TABLET | ORAL | Status: DC | PRN

## 2012-11-07 MED ORDER — LACTATED RINGERS IV SOLN
500.0000 mL | INTRAVENOUS | Status: DC | PRN
  Administered 2012-11-08: 500 mL via INTRAVENOUS

## 2012-11-07 MED ORDER — NALBUPHINE SYRINGE 5 MG/0.5 ML
10.0000 mg | INJECTION | INTRAMUSCULAR | Status: DC | PRN
  Administered 2012-11-08: 10 mg via INTRAVENOUS
  Filled 2012-11-07: qty 1

## 2012-11-07 MED ORDER — DOCUSATE SODIUM 100 MG PO CAPS: 100.0000 mg | ORAL_CAPSULE | Freq: Every day | ORAL | Status: DC

## 2012-11-07 MED ORDER — FLEET ENEMA 7-19 GM/118ML RE ENEM: 1.0000 | ENEMA | RECTAL | Status: DC | PRN

## 2012-11-07 MED ORDER — ACETAMINOPHEN 325 MG PO TABS
650.0000 mg | ORAL_TABLET | ORAL | Status: DC | PRN
  Filled 2012-11-07: qty 2

## 2012-11-07 MED ORDER — OXYCODONE-ACETAMINOPHEN 5-325 MG PO TABS: 1.0000 | ORAL_TABLET | ORAL | Status: DC | PRN

## 2012-11-07 MED ORDER — OXYTOCIN 40 UNITS IN LACTATED RINGERS INFUSION - SIMPLE MED
62.5000 mL/h | INTRAVENOUS | Status: DC
  Administered 2012-11-08: 62.5 mL/h via INTRAVENOUS
  Filled 2012-11-07: qty 1000

## 2012-11-07 MED ORDER — OXYTOCIN BOLUS FROM INFUSION: 500.0000 mL | INTRAVENOUS | Status: DC

## 2012-11-07 MED ORDER — MAGNESIUM SULFATE 40 G IN LACTATED RINGERS - SIMPLE
2.0000 g/h | INTRAVENOUS | Status: AC
  Administered 2012-11-08: 2 g/h via INTRAVENOUS
  Filled 2012-11-07 (×2): qty 500

## 2012-11-07 MED ORDER — HYDRALAZINE HCL 20 MG/ML IJ SOLN: 10.0000 mg | Freq: Once | INTRAMUSCULAR | Status: AC | PRN

## 2012-11-07 MED ORDER — LIDOCAINE HCL (PF) 1 % IJ SOLN
30.0000 mL | INTRAMUSCULAR | Status: DC | PRN
  Filled 2012-11-07: qty 30

## 2012-11-07 MED ORDER — CITRIC ACID-SODIUM CITRATE 334-500 MG/5ML PO SOLN
30.0000 mL | ORAL | Status: DC | PRN
  Filled 2012-11-07: qty 15

## 2012-11-07 MED ORDER — ONDANSETRON HCL 4 MG/2ML IJ SOLN: 4.0000 mg | Freq: Four times a day (QID) | INTRAMUSCULAR | Status: DC | PRN

## 2012-11-07 MED ORDER — LACTATED RINGERS IV SOLN
INTRAVENOUS | Status: DC
  Administered 2012-11-07: 23:00:00 via INTRAVENOUS

## 2012-11-07 MED ORDER — IBUPROFEN 600 MG PO TABS: 600.0000 mg | ORAL_TABLET | Freq: Four times a day (QID) | ORAL | Status: DC | PRN

## 2012-11-07 NOTE — Consult Note (Signed)
Admit date: 11/07/2012 Referring Physician  Dr. Elsie Lincoln Primary Physician [facility], Marylene Land, MD Primary Cardiologist  None Reason for Consultation  Syncope  HPI: 32 year old female in late third trimester of pregnancy who has increased pressure, obesity, and had to separate syncopal episodes witnessed. Felt weak and husband noted that she was difficult to arouse. Layed back on bed. No chest pain. She may have felt some shortness of breath but currently is in her normal state of health.  I was consulted to assess her overall cardiac function prior to induction. An echocardiogram was performed and demonstrated normal ejection fraction, hyperdynamic in fact with mildly dilated left atrium, mildly thickened left ventricle consistent with long-standing hypertension. Right ventricle appeared normal. There were no significant structural abnormalities of her heart. She did have one episode here of "seeing stars". I gave her IVF bolus.     PMH:   Past Medical History  Diagnosis Date  . Hypertension   . Anemia   . Heart murmur   . Morbid obesity   . Fibroid   . Diabetes mellitus without complication   . History of preterm delivery, currently pregnant 08/12/2012  . Gestational diabetes     PSH:   Past Surgical History  Procedure Laterality Date  . Vaginal delivery      x 3  . No past surgeries     Allergies:  Kiwi extract and Latex Prior to Admit Meds:   Prescriptions prior to admission  Medication Sig Dispense Refill  . acetaminophen (TYLENOL) 500 MG tablet Take 500 mg by mouth every 6 (six) hours as needed. pain      . glyBURIDE (DIABETA) 5 MG tablet Take 10 mg by mouth 2 (two) times daily with a meal.      . labetalol (NORMODYNE) 100 MG tablet Take 3 tablets (300 mg total) by mouth 2 (two) times daily.  216 tablet  0  . omeprazole (PRILOSEC) 20 MG capsule Take 20 mg by mouth daily as needed. For nausea or heartburn      . Prenatal Vit-Fe Fumarate-FA (PRENATAL MULTIVITAMIN) TABS Take 1  tablet by mouth at bedtime.        Fam HX:    Family History  Problem Relation Age of Onset  . Other Neg Hx    Social HX:    History   Social History  . Marital Status: Single    Spouse Name: N/A    Number of Children: N/A  . Years of Education: N/A   Occupational History  . Not on file.   Social History Main Topics  . Smoking status: Never Smoker   . Smokeless tobacco: Never Used  . Alcohol Use: No  . Drug Use: No  . Sexually Active: Yes    Birth Control/ Protection: None   Other Topics Concern  . Not on file   Social History Narrative  . No narrative on file     ROS:  No recent illness, denies dehydration, no CP, no SOB. All 11 ROS were addressed and are negative except what is stated in the HPI  Physical Exam: Blood pressure 159/104, pulse 117, temperature 98.2 F (36.8 C), temperature source Oral, resp. rate 18, height 5\' 5"  (1.651 m), weight 117.935 kg (260 lb), last menstrual period 02/05/2012, SpO2 98.00%.    General: Well developed, well nourished, in no acute distress Head: Eyes PERRLA, No xanthomas.   Normal cephalic and atramatic  Lungs:   Clear bilaterally to auscultation and percussion. Normal respiratory effort. No  wheezes, no rales. Heart:   HRRR S1 S2 Pulses are 2+ & equal. 2/6 SEM murmur  No carotid bruit. No JVD.  No abdominal bruits. No femoral bruits. Abdomen: Bowel sounds are positive, abdomen soft and non-tender without masses. Pregnant. Obese Msk:  Back normal. Normal strength and tone for age. Extremities:   No clubbing, cyanosis , trace edema.  DP +1 Neuro: Alert and oriented X 3, non-focal, MAE x 4 GU: Deferred Rectal: Deferred Psych:  Good affect, responds appropriately    Labs:   Lab Results  Component Value Date   WBC 8.1 11/07/2012   HGB 11.7* 11/07/2012   HCT 36.7 11/07/2012   MCV 71.4* 11/07/2012   PLT 265 11/07/2012    Recent Labs Lab 11/07/12 1625  NA 135  K 3.8  CL 100  CO2 23  BUN 6  CREATININE 0.54  CALCIUM 9.3   PROT 6.5  BILITOT 0.3  ALKPHOS 90  ALT 18  AST 18  GLUCOSE 68*     Lab Results  Component Value Date   CHOL 183 08/20/2009   Lab Results  Component Value Date   HDL 41 08/20/2009   Lab Results  Component Value Date   LDLCALC 114* 08/20/2009   Lab Results  Component Value Date   TRIG 140 08/20/2009   Lab Results  Component Value Date   CHOLHDL 4.5 Ratio 08/20/2009   No results found for this basename: LDLDIRECT      Radiology:  US Fetal Bpp W/o Non Stress  11/07/2012  *RADIOLOGY REPORT*   Clinical Data: 32 year old pregnant female with decreased fetal movement and nonreactive NST.  Assigned gestational age of [redacted] weeks 2 days.   BIOPHYSICAL PROFILE  Number of Fetuses: 1 Heart Rate:  152bpm Presentation:  Cephalic Movement: Present  BPP: Movement: 2 Breathing:  0 Tone: 2 Amniotic Fluid: 2  Total Score: 6/8  IMPRESSION: Single living intrauterine gestation in cephalic presentation with a BPP of 6/8.  No breathing visualized during examination.  This exam is performed on an emergent basis and does not comprehensively evaluate fetal size, dating, or anatomy, and a follow-up complete OB US should be considered if further fetal assessment is warranted.   Original Report Authenticated By: Harmon Pier, M.D.    Personally viewed.  EKG:  Sinus rhythm, rate 93, left atrial enlargement, borderline LVH pattern. No ST segment changes. Normal conduction pattern. Personally viewed.   ECHO: Normal EF, LVH   ASSESSMENT/PLAN:   32 year old female [redacted] weeks pregnant with hypertension, syncopal episode awaiting induction.  1. Syncope-structurally normal heart. Reassuring. Hyperdynamic. It is plausible that she may have been slightly intravascular volume depleted which led to syncope. I do not see any structural abnormalities with her heart. Her EKG is reassuring. I do believe it is advisable to proceed with induction. I will give her a bolus of fluid.  Her murmur is a flow murmur. No  evidence of valvular abnormality.   Please let us know if any further assistance is necessary. Continue with adequate hydration.  Donato Schultz, MD  11/07/2012  8:26 PM

## 2012-11-07 NOTE — H&P (Signed)
Latasha Simmons    Kasee Hantz is a 32 y.o. [phone] at [redacted]w[redacted]d admitted for syncope at term, chronic HTN with possible superimposed preeclampsia and A2 GDM.  Pt does c/o headache and spots in vision today, though this has been present off and on for some time.  Fetal presentation is cephalic.  History of Present Illness: Patient sent from clinic today after NST due to two syncopal episodes in past 2 days. First episode last night around 10 PM. 2 hours after dinner. Standing up in kitchen, felt hard to breath, heart beating fast and next thing she knew she was on floor and husband said she had passed out. Today after breakfast, patient was walking in living room and felt same thing - hard to breath, heart beating fast, lightheaded, woke up on floor with husband calling her name. Checked sugar after episode - 111.  Pt seen in [facility] for GDM (on glyuburide) and CHTN (on labetalol). Reports fetal movement as decreased.  Patient reports uterine contraction activity as irregular. Patient reports  vaginal bleeding as none. Patient describes fluid per vagina as None.  Patient Active Problem List  Diagnosis  . FIBROIDS, UTERUS  . Morbid obesity  . HYPERTENSION  . ALLERGIC RHINITIS CAUSE UNSPECIFIED  . KELOID  . Headache  . Susceptible to varicella (non-immune), currently pregnant  . Diabetes mellitus, antepartum  . Chronic hypertension in pregnancy, antepartum  . H/O pre-eclampsia in prior pregnancy, currently pregnant  . Supervision of High-risk pregnancy  . Marginal cord insertion, antepartum  . Non-reactive NST (non-stress test)   Past Medical History: Past Medical History  Diagnosis Date  . Hypertension   . Anemia   . Heart murmur   . Morbid obesity   . Fibroid   . Diabetes mellitus without complication   . History of preterm delivery, currently pregnant 08/12/2012  . Gestational diabetes   ] Past Surgical History: Past Surgical History  Procedure Laterality Date   . Vaginal delivery      x 3  . No past surgeries      Obstetrical History: OB History   Grav Para Term Preterm Abortions TAB SAB Ect Mult Living   4 3 1 2      3       Social History: History   Social History  . Marital Status: Single    Spouse Name: N/A    Number of Children: N/A  . Years of Education: N/A   Occupational History  . Not on file.   Social History Main Topics  . Smoking status: Never Smoker   . Smokeless tobacco: Never Used  . Alcohol Use: No  . Drug Use: No  . Sexually Active: Yes    Birth Control/ Protection: None   Other Topics Concern  . Not on file   Social History Narrative  . No narrative on file    Family History: Family History  Problem Relation Age of Onset  . Other Neg Hx    Allergies: Allergies  Allergen Reactions  . Kiwi Extract Nausea Only  . Latex Itching    Medications Prior to Admission  Medication Sig Dispense Refill  . acetaminophen (TYLENOL) 500 MG tablet Take 500 mg by mouth every 6 (six) hours as needed. pain      . glyBURIDE (DIABETA) 5 MG tablet Take 10 mg by mouth 2 (two) times daily with a meal.      . labetalol (NORMODYNE) 100 MG tablet Take 3 tablets (300 mg total) by mouth 2 (two)  times daily.  216 tablet  0  . omeprazole (PRILOSEC) 20 MG capsule Take 20 mg by mouth daily as needed. For nausea or heartburn      . Prenatal Vit-Fe Fumarate-FA (PRENATAL MULTIVITAMIN) TABS Take 1 tablet by mouth at bedtime.         Review of Systems  Constitutional: Negative for fever and chills.  Eyes:      Spots  Respiratory: Positive for shortness of breath. Negative for cough and wheezing.   Cardiovascular: Negative for chest pain.  Gastrointestinal: Negative for nausea, vomiting, abdominal pain, diarrhea and constipation.  Genitourinary: Negative for dysuria.  Neurological: Positive for dizziness and headaches.   Physical Exam   Blood pressure 159/98, pulse 105, temperature 98.8 F (37.1 C), temperature source Oral,  resp. rate 18, height 5\' 5"  (1.651 m), weight 117.935 kg (260 lb), last menstrual period 02/05/2012, SpO2 98.00%.  Filed Vitals:   11/07/12 1541 11/07/12 1542 11/07/12 1543 11/07/12 1547  BP:  157/89 152/90 159/98  Pulse:  97 99 105  Temp:      TempSrc:      Resp:      Height:      Weight:      SpO2: 98% 98% 98%    Physical Exam  Constitutional: She is oriented to person, place, and time. She appears well-developed and well-nourished. No distress.  Looks drowsy  HENT:  Head: Normocephalic and atraumatic.  Eyes: Conjunctivae and EOM are normal.  Neck: Normal range of motion. Neck supple.  Cardiovascular: Normal rate, regular rhythm and normal heart sounds.   Respiratory: Effort normal and breath sounds normal. No respiratory distress.  GI: Soft. Bowel sounds are normal. There is no tenderness. There is no rebound and no guarding.  Neurological: She is alert and oriented to person, place, and time.  Skin: Skin is warm and dry.  Extremities: extremities normal, atraumatic, no cyanosis or edema with Membranes: intact  Fetal Monitoring:  Baseline: 150, min var mostly with some periods of mod variability. Occasional accel, occasional variable and occasional late decel  Labs:  Results for orders placed during the hospital encounter of 11/07/12 (from the past 24 hour(s))  URINALYSIS, ROUTINE W REFLEX MICROSCOPIC     Status: Abnormal   Collection Time    11/07/12  3:15 PM      Result Value Range   Color, Urine YELLOW  YELLOW   APPearance CLEAR  CLEAR   Specific Gravity, Urine 1.020  1.005 - 1.030   pH 6.0  5.0 - 8.0   Glucose, UA NEGATIVE  NEGATIVE mg/dL   Hgb urine dipstick LARGE (*) NEGATIVE   Bilirubin Urine NEGATIVE  NEGATIVE   Ketones, ur 15 (*) NEGATIVE mg/dL   Protein, ur NEGATIVE  NEGATIVE mg/dL   Urobilinogen, UA 0.2  0.0 - 1.0 mg/dL   Nitrite NEGATIVE  NEGATIVE   Leukocytes, UA NEGATIVE  NEGATIVE  URINE MICROSCOPIC-ADD ON     Status: None   Collection Time     11/07/12  3:15 PM      Result Value Range   Squamous Epithelial / LPF RARE  RARE   WBC, UA 0-2  <3 WBC/hpf   RBC / HPF 21-50  <3 RBC/hpf   Bacteria, UA RARE  RARE   Urine-Other MUCOUS PRESENT    PROTEIN / CREATININE RATIO, URINE     Status: Abnormal   Collection Time    11/07/12  3:15 PM      Result Value Range   Creatinine, Urine  161.64     Total Protein, Urine 31.2     PROTEIN CREATININE RATIO 0.19 (*) 0.00 - 0.15  COMPREHENSIVE METABOLIC PANEL     Status: Abnormal   Collection Time    11/07/12  4:25 PM      Result Value Range   Sodium 135  135 - 145 mEq/L   Potassium 3.8  3.5 - 5.1 mEq/L   Chloride 100  96 - 112 mEq/L   CO2 23  19 - 32 mEq/L   Glucose, Bld 68 (*) 70 - 99 mg/dL   BUN 6  6 - 23 mg/dL   Creatinine, Ser 1.61  0.50 - 1.10 mg/dL   Calcium 9.3  8.4 - 09.6 mg/dL   Total Protein 6.5  6.0 - 8.3 g/dL   Albumin 2.9 (*) 3.5 - 5.2 g/dL   AST 18  0 - 37 U/L   ALT 18  0 - 35 U/L   Alkaline Phosphatase 90  39 - 117 U/L   Total Bilirubin 0.3  0.3 - 1.2 mg/dL   GFR calc non Af Amer >90  >90 mL/min   GFR calc Af Amer >90  >90 mL/min  CBC     Status: Abnormal   Collection Time    11/07/12  4:25 PM      Result Value Range   WBC 8.1  4.0 - 10.5 K/uL   RBC 5.14 (*) 3.87 - 5.11 MIL/uL   Hemoglobin 11.7 (*) 12.0 - 15.0 g/dL   HCT 04.5  40.9 - 81.1 %   MCV 71.4 (*) 78.0 - 100.0 fL   MCH 22.8 (*) 26.0 - 34.0 pg   MCHC 31.9  30.0 - 36.0 g/dL   RDW 91.4 (*) 78.2 - 95.6 %   Platelets 265  150 - 400 K/uL  TYPE AND SCREEN     Status: None   Collection Time    11/07/12  6:00 PM      Result Value Range   ABO/RH(D) A POS     Antibody Screen NEG     Sample Expiration 11/10/2012    GLUCOSE, CAPILLARY     Status: None   Collection Time    11/07/12  6:30 PM      Result Value Range   Glucose-Capillary 80  70 - 99 mg/dL   Imaging Studies: BPP pending  Scheduled Meds: . docusate sodium  100 mg Oral Daily  . glyBURIDE  10 mg Oral BID WC  . labetalol  300 mg Oral BID  .  magnesium  4 g Intravenous Once  . pantoprazole  40 mg Oral Daily  . prenatal multivitamin  1 tablet Oral QHS  . [START ON 11/08/2012] prenatal multivitamin  1 tablet Oral Q1200  . sodium chloride  3 mL Intravenous Q12H    ASSESSMENT: 32 y.o. [redacted]w[redacted]d at [redacted]w[redacted]d with CHTN and A2 GDM and syncopal episodes - Admit for observation - Cardiology consult for syncope and safety of induction - 24 hour urine protein -  CBG qid -  Continuous monitoring  Napoleon Form, MD

## 2012-11-07 NOTE — Progress Notes (Signed)
Patient came back from bathroom and changed position to right lateral and started to feel nauseous and light headed.  We changed position to semi fowlers and put on pulse ox. Patient was still oriented and verbalized seeing spots.  This episode lasted for approx two minutes and then patient stated she felt "fine".

## 2012-11-07 NOTE — Progress Notes (Signed)
  Echocardiogram 2D Echocardiogram has been performed.  Cathie Beams 11/07/2012, 7:48 PM

## 2012-11-07 NOTE — Progress Notes (Signed)
Dr Thad Ranger notified of maternal vital signs, syncope episode, and fetal heart rate. No new orders at this time

## 2012-11-07 NOTE — MAU Note (Signed)
Patient was sent from the [facility] for evaluation of hypertension and feeling faint.

## 2012-11-07 NOTE — Progress Notes (Signed)
Pulse- 99 Patient is reporting still only feeling the baby move 1-2 times per day. Also reports that starting last night she started to get very lightheaded and dizzy after a headache and fainted, reports the same thing happening this morning as well. When asked why she didn't go to MAU was because "she would just wait around for hours and hours and all they would do is send her home"; Called Dr Penne Lash and informed her of patient's fainting episodes x2, received order for patient to go to MAU for further evaluation.

## 2012-11-07 NOTE — Progress Notes (Signed)
Patient ID: Latasha Simmons, female   DOB: 1980/12/11, 32 y.o.   MRN: [id number]  S:  Pt still c/o mild headache  O; Filed Vitals:   11/07/12 2046 11/07/12 2051 11/07/12 2054 11/07/12 2156  BP:   160/100 159/89  Pulse: 110 107 117 98  Temp:      TempSrc:      Resp:   18 18  Height:      Weight:      SpO2: 98% 98%     BP in 170s/90s x 2  BPP 6/8:  -2 for no fetal breathing, cephalic  FHTs:  119-147, periods of minimal variability, accels occasionally, occasional variable decel:  Cat II  Per cardiology okay to labor.  A/P 32 y.o. [id number] at [redacted]w[redacted]d with A2 GDM, CHTN and now superimposed preeclampsia (worsening BP, headache) and 6/8 BPP  - Induce with cytotec - MgSO4 - Hydralazine for severe range BP - Possible LGA baby: 8#4oz on 2/20 (>90%).  Pt has had 2 preterm babies (34 weeks) and one full-term weighing around 6 lb per pt.  - Hopeful for SVD  Napoleon Form

## 2012-11-08 ENCOUNTER — Inpatient Hospital Stay (HOSPITAL_COMMUNITY): Payer: Medicaid Other | Admitting: Anesthesiology

## 2012-11-08 ENCOUNTER — Encounter (HOSPITAL_COMMUNITY): Payer: Self-pay | Admitting: Anesthesiology

## 2012-11-08 LAB — PREPARE RBC (CROSSMATCH)

## 2012-11-08 LAB — CBC
Hemoglobin: 11.2 g/dL — ABNORMAL LOW (ref 12.0–15.0)
MCH: 22.8 pg — ABNORMAL LOW (ref 26.0–34.0)
MCHC: 32.1 g/dL (ref 30.0–36.0)
MCV: 71.1 fL — ABNORMAL LOW (ref 78.0–100.0)
Platelets: 240 10*3/uL (ref 150–400)
RBC: 4.92 MIL/uL (ref 3.87–5.11)
RDW: 15.9 % — ABNORMAL HIGH (ref 11.5–15.5)
WBC: 7.7 10*3/uL (ref 4.0–10.5)
WBC: 12.9 10*3/uL — ABNORMAL HIGH (ref 4.0–10.5)

## 2012-11-08 LAB — GLUCOSE, CAPILLARY
Glucose-Capillary: 82 mg/dL (ref 70–99)
Glucose-Capillary: 85 mg/dL (ref 70–99)
Glucose-Capillary: 110 mg/dL — ABNORMAL HIGH (ref 70–99)
Glucose-Capillary: 141 mg/dL — ABNORMAL HIGH (ref 70–99)

## 2012-11-08 LAB — MRSA PCR SCREENING: MRSA by PCR: NEGATIVE

## 2012-11-08 MED ORDER — ONDANSETRON HCL 4 MG/2ML IJ SOLN: 4.0000 mg | INTRAMUSCULAR | Status: DC | PRN

## 2012-11-08 MED ORDER — TERBUTALINE SULFATE 1 MG/ML IJ SOLN: 0.2500 mg | Freq: Once | INTRAMUSCULAR | Status: DC | PRN

## 2012-11-08 MED ORDER — OXYTOCIN 40 UNITS IN LACTATED RINGERS INFUSION - SIMPLE MED
1.0000 m[IU]/min | INTRAVENOUS | Status: DC
  Administered 2012-11-08: 2 m[IU]/min via INTRAVENOUS

## 2012-11-08 MED ORDER — LIDOCAINE HCL (PF) 1 % IJ SOLN
INTRAMUSCULAR | Status: DC | PRN
  Administered 2012-11-08 (×2): 5 mL

## 2012-11-08 MED ORDER — EPHEDRINE 5 MG/ML INJ: 10.0000 mg | INTRAVENOUS | Status: DC | PRN

## 2012-11-08 MED ORDER — BENZOCAINE-MENTHOL 20-0.5 % EX AERO: 1.0000 "application " | INHALATION_SPRAY | CUTANEOUS | Status: DC | PRN

## 2012-11-08 MED ORDER — TETANUS-DIPHTH-ACELL PERTUSSIS 5-2.5-18.5 LF-MCG/0.5 IM SUSP
0.5000 mL | Freq: Once | INTRAMUSCULAR | Status: DC
  Filled 2012-11-08: qty 0.5

## 2012-11-08 MED ORDER — PHENYLEPHRINE 40 MCG/ML (10ML) SYRINGE FOR IV PUSH (FOR BLOOD PRESSURE SUPPORT)
80.0000 ug | PREFILLED_SYRINGE | INTRAVENOUS | Status: DC | PRN
  Filled 2012-11-08: qty 5

## 2012-11-08 MED ORDER — PHENYLEPHRINE 40 MCG/ML (10ML) SYRINGE FOR IV PUSH (FOR BLOOD PRESSURE SUPPORT): 80.0000 ug | PREFILLED_SYRINGE | INTRAVENOUS | Status: DC | PRN

## 2012-11-08 MED ORDER — PRENATAL MULTIVITAMIN CH
1.0000 | ORAL_TABLET | Freq: Every day | ORAL | Status: DC
  Administered 2012-11-09 – 2012-11-10 (×2): 1 via ORAL
  Filled 2012-11-08 (×2): qty 1

## 2012-11-08 MED ORDER — SENNOSIDES-DOCUSATE SODIUM 8.6-50 MG PO TABS
2.0000 | ORAL_TABLET | Freq: Every day | ORAL | Status: DC
  Administered 2012-11-08 – 2012-11-09 (×2): 2 via ORAL

## 2012-11-08 MED ORDER — SIMETHICONE 80 MG PO CHEW: 80.0000 mg | CHEWABLE_TABLET | ORAL | Status: DC | PRN

## 2012-11-08 MED ORDER — EPHEDRINE 5 MG/ML INJ
10.0000 mg | INTRAVENOUS | Status: DC | PRN
  Filled 2012-11-08: qty 4

## 2012-11-08 MED ORDER — ZOLPIDEM TARTRATE 5 MG PO TABS: 5.0000 mg | ORAL_TABLET | Freq: Every evening | ORAL | Status: DC | PRN

## 2012-11-08 MED ORDER — IBUPROFEN 600 MG PO TABS
600.0000 mg | ORAL_TABLET | Freq: Four times a day (QID) | ORAL | Status: DC
  Administered 2012-11-08 – 2012-11-10 (×8): 600 mg via ORAL
  Filled 2012-11-08 (×8): qty 1

## 2012-11-08 MED ORDER — LACTATED RINGERS IV SOLN: 500.0000 mL | Freq: Once | INTRAVENOUS | Status: DC

## 2012-11-08 MED ORDER — OXYCODONE-ACETAMINOPHEN 5-325 MG PO TABS
1.0000 | ORAL_TABLET | ORAL | Status: DC | PRN
  Administered 2012-11-09 (×2): 1 via ORAL
  Filled 2012-11-08 (×2): qty 1

## 2012-11-08 MED ORDER — DIPHENHYDRAMINE HCL 25 MG PO CAPS: 25.0000 mg | ORAL_CAPSULE | Freq: Four times a day (QID) | ORAL | Status: DC | PRN

## 2012-11-08 MED ORDER — DIBUCAINE 1 % RE OINT: 1.0000 "application " | TOPICAL_OINTMENT | RECTAL | Status: DC | PRN

## 2012-11-08 MED ORDER — LANOLIN HYDROUS EX OINT: TOPICAL_OINTMENT | CUTANEOUS | Status: DC | PRN

## 2012-11-08 MED ORDER — LACTATED RINGERS IV SOLN
INTRAVENOUS | Status: DC
  Administered 2012-11-08 – 2012-11-09 (×3): via INTRAVENOUS

## 2012-11-08 MED ORDER — FENTANYL 2.5 MCG/ML BUPIVACAINE 1/10 % EPIDURAL INFUSION (WH - ANES)
14.0000 mL/h | INTRAMUSCULAR | Status: DC
  Administered 2012-11-08: 14 mL/h via EPIDURAL
  Filled 2012-11-08: qty 125

## 2012-11-08 MED ORDER — PNEUMOCOCCAL VAC POLYVALENT 25 MCG/0.5ML IJ INJ
0.5000 mL | INJECTION | INTRAMUSCULAR | Status: AC
  Administered 2012-11-09: 0.5 mL via INTRAMUSCULAR
  Filled 2012-11-08: qty 0.5

## 2012-11-08 MED ORDER — WITCH HAZEL-GLYCERIN EX PADS: 1.0000 "application " | MEDICATED_PAD | CUTANEOUS | Status: DC | PRN

## 2012-11-08 MED ORDER — DIPHENHYDRAMINE HCL 50 MG/ML IJ SOLN: 12.5000 mg | INTRAMUSCULAR | Status: DC | PRN

## 2012-11-08 MED ORDER — ONDANSETRON HCL 4 MG PO TABS: 4.0000 mg | ORAL_TABLET | ORAL | Status: DC | PRN

## 2012-11-08 NOTE — Progress Notes (Signed)
S/O:  Doing well but hurting with contractions.           Filed Vitals:   11/08/12 0913  BP: 171/106  Pulse: 91  Temp:   Resp:              Fetal heart rate stable, but nonreactive with small decels with contractions. Most appear to be with contractions, but one looked like it had late timing.            UCs spaced out to q 4-5 minutes           Cervix deferred until after she gets her epidural.  A:  SIUP at [redacted]w[redacted]d      Hypertension      GDM A2  P:  Continue care       Will insert IUPC after epidural placed       Restart Pitocin

## 2012-11-08 NOTE — Anesthesia Procedure Notes (Signed)
Epidural Patient location during procedure: OB Start time: 11/08/2012 9:18 AM  Staffing Anesthesiologist: Angus Seller., Harrell Gave. Performed by: anesthesiologist   Preanesthetic Checklist Completed: patient identified, site marked, surgical consent, pre-op evaluation, timeout performed, IV checked, risks and benefits discussed and monitors and equipment checked  Epidural Patient position: sitting Prep: site prepped and draped and DuraPrep Patient monitoring: continuous pulse ox and blood pressure Approach: midline Injection technique: LOR air and LOR saline  Needle:  Needle type: Tuohy  Needle gauge: 17 G Needle length: 9 cm and 9 Needle insertion depth: 7 cm Catheter type: closed end flexible Catheter size: 19 Gauge Catheter at skin depth: 13 cm Test dose: negative  Assessment Events: blood not aspirated, injection not painful, no injection resistance, negative IV test and no paresthesia  Additional Notes Patient identified.  Risk benefits discussed including failed block, incomplete pain control, headache, nerve damage, paralysis, blood pressure changes, nausea, vomiting, reactions to medication both toxic or allergic, and postpartum back pain.  Patient expressed understanding and wished to proceed.  All questions were answered.  Sterile technique used throughout procedure and epidural site dressed with sterile barrier dressing. No paresthesia or other complications noted.The patient did not experience any signs of intravascular injection such as tinnitus or metallic taste in mouth nor signs of intrathecal spread such as rapid motor block. Please see nursing notes for vital signs.

## 2012-11-08 NOTE — Progress Notes (Signed)
Patient ID: Latasha Simmons, female   DOB: Sep 28, 1980, 32 y.o.   MRN: [id number]   S: Pt's water broke. Thick meconium. Feeling cramping, no strong ctx yet. Denies HA, vision changes, RUQ pain this morning.  OCeasar Mons Vitals:   11/08/12 0505 11/08/12 0602 11/08/12 0701 11/08/12 0718  BP: 152/88 153/82 163/103 150/84  Pulse: 94 99 97 93  Temp:   98.2 F (36.8 C)   TempSrc:      Resp: 18 18 18 18   Height:      Weight:      SpO2:        Cervix:  1.5/50/-3, mid, soft  SROM:  Thick meconium  FHTs:  145-150, min to mod variability, no accels (non-reactive), intermittent late decels overnight TOCO:  q 5-7 minutes  A/P 32 y.o. [id number] at [redacted]w[redacted]d with A2 GDM, chronic HTN, preeclampsia  - IOL for preeclampsia, s/p 2 doses cytotec. Foley bulb placed now. SROM -  On magnesium, BP controlled. On labetalol 300 mg BID. Hydralazine IV prn -  FHTs category II - watch closely.  -  Anticipate SVD. EFW 8 lb, 4oz on 2/20.  Napoleon Form, MD

## 2012-11-08 NOTE — Anesthesia Preprocedure Evaluation (Signed)
Anesthesia Evaluation  Patient identified by MRN, date of birth, ID band Patient awake    Reviewed: Allergy & Precautions, H&P , Patient's Chart, lab work & pertinent test results  Airway Mallampati: III TM Distance: >3 FB Neck ROM: full    Dental no notable dental hx.    Pulmonary neg pulmonary ROS,  breath sounds clear to auscultation  Pulmonary exam normal       Cardiovascular hypertension, negative cardio ROS  Rhythm:regular Rate:Normal     Neuro/Psych  Headaches, negative neurological ROS  negative psych ROS   GI/Hepatic negative GI ROS, Neg liver ROS,   Endo/Other  negative endocrine ROSdiabetesMorbid obesity  Renal/GU negative Renal ROS     Musculoskeletal   Abdominal   Peds  Hematology negative hematology ROS (+)   Anesthesia Other Findings Hypertension     Anemia        Heart murmur- work up with cards (echo/ekg)     Morbid obesity        Fibroid     Diabetes mellitus without complication        History of preterm delivery, currently pregnant 08/12/2012   Gestational diabetes    Reproductive/Obstetrics (+) Pregnancy                           Anesthesia Physical Anesthesia Plan  ASA: III  Anesthesia Plan: Epidural   Post-op Pain Management:    Induction:   Airway Management Planned:   Additional Equipment:   Intra-op Plan:   Post-operative Plan:   Informed Consent: I have reviewed the patients History and Physical, chart, labs and discussed the procedure including the risks, benefits and alternatives for the proposed anesthesia with the patient or authorized representative who has indicated his/her understanding and acceptance.     Plan Discussed with:   Anesthesia Plan Comments:         Anesthesia Quick Evaluation

## 2012-11-08 NOTE — Progress Notes (Addendum)
Latasha Simmons is a 32 y.o. [phone] at [redacted]w[redacted]d by ultrasound admitted for induction of labor due to Gestational diabetes and Hypertension.  Subjective: Comfortable with epidural  Objective: BP 149/95  Pulse 98  Temp(Src) 98.6 F (37 C) (Oral)  Resp 20  Ht 5\' 5"  (1.651 m)  Wt 260 lb (117.935 kg)  BMI 43.27 kg/m2  SpO2 98%  LMP 02/05/2012 I/O last 3 completed shifts: In: 1304.6 [P.O.:240; I.V.:1064.6] Out: 1850 [Urine:1850] Total I/O In: 1379.4 [P.O.:450; I.V.:929.4] Out: 800 [Urine:800] Filed Vitals:   11/08/12 1131 11/08/12 1132 11/08/12 1202 11/08/12 1230  BP:  146/94 156/86 149/95  Pulse: 90  90 98  Temp:   98.6 F (37 C)   TempSrc:   Oral   Resp:  22 18 20   Height:      Weight:      SpO2:        FHT:  FHR: 140 bpm, variability: minimal ,  accelerations:  Abscent,  decelerations:  Present Early in appearance UC:   irregular, every 1-3 minutes SVE:   Dilation: 4 Effacement (%): 80 Station: -2 Exam by:: M.Prem Coykendall,CNM Head better applied, more effaced  Labs: Lab Results  Component Value Date   WBC 7.7 11/08/2012   HGB 11.2* 11/08/2012   HCT 35.0* 11/08/2012   MCV 71.1* 11/08/2012   PLT 240 11/08/2012    Assessment / Plan: Induction of labor due to gestational hypertension and gestational diabetes,  progressing well on pitocin  Labor: Progressing normally and Progressing on Pitocin, will continue to increase then AROM Preeclampsia:  n/a Fetal Wellbeing:  Category II Pain Control:  Epidural I/D:  n/a Anticipated MOD:  NSVD Magnesium Sulfate infusing, no signs of toxicity  Harrison Zetina 11/08/2012, 12:53 PM

## 2012-11-08 NOTE — Progress Notes (Signed)
Patient ID: Latasha Simmons, female   DOB: [birth year], 32 y.o.   MRN: [id number]  S:  Called to room for deep variable to 60. Pt repositioned, bolus started.  O:  Filed Vitals:   11/08/12 0718  BP: 150/84  Pulse: 93  Temp:   Resp: 18    Cervix:  Foley bulb fell out. Now 3/50/-3  FHTs:  Baseline change to 135, mod var for a while after decel, now min-mod. No accels.   TOCO:  q 7-8 minutes  A/P FSE placed. Not able to place IUPC due to high station.  Monitor FHTs closely. Start pit once baby recovers. IUPC and amnioinfusion if needed. Pt desires epidural.  Napoleon Form, MD

## 2012-11-09 LAB — CBC
MCHC: 32.2 g/dL (ref 30.0–36.0)
Platelets: 208 10*3/uL (ref 150–400)
RDW: 16.1 % — ABNORMAL HIGH (ref 11.5–15.5)
WBC: 10.2 10*3/uL (ref 4.0–10.5)

## 2012-11-09 LAB — COMPREHENSIVE METABOLIC PANEL
ALT: 12 U/L (ref 0–35)
AST: 15 U/L (ref 0–37)
Albumin: 2.1 g/dL — ABNORMAL LOW (ref 3.5–5.2)
Alkaline Phosphatase: 69 U/L (ref 39–117)
Potassium: 3.6 mEq/L (ref 3.5–5.1)
Sodium: 136 mEq/L (ref 135–145)
Total Protein: 4.8 g/dL — ABNORMAL LOW (ref 6.0–8.3)

## 2012-11-09 NOTE — Anesthesia Postprocedure Evaluation (Signed)
Anesthesia Post Note  Patient: Latasha Simmons  Procedure(s) Performed: * No procedures listed *  Anesthesia type: Epidural  Patient location: Mother/Baby  Post pain: Pain level controlled  Post assessment: Post-op Vital signs reviewed  Last Vitals:  Filed Vitals:   11/09/12 1345  BP: 137/67  Pulse:   Temp:   Resp:     Post vital signs: Reviewed  Level of consciousness:alert  Complications: No apparent anesthesia complications

## 2012-11-09 NOTE — Progress Notes (Signed)
Post Partum Day #1 Subjective: no complaints and tolerating PO; infant in NICU- pumping; feels well; denies H/A, RUQ pain or visual disturbances; planning Nexplanon for contraception; will d/c Mag at 1400 and tx to floor  Objective: Blood pressure 136/85, pulse 92, temperature 98.1 F (36.7 C), temperature source Oral, resp. rate 20, height 5\' 6"  (1.676 m), weight 251 lb 4.8 oz (113.989 kg), last menstrual period 02/05/2012, SpO2 96.00%. I&O: + 1L but beginning to diurese  Physical Exam:  General: alert, cooperative and no distress Lochia: appropriate Uterine Fundus: firm DVT Evaluation: No evidence of DVT seen on physical exam.   Recent Labs  11/08/12 1552 11/09/12 0502  HGB 11.1* 9.5*  HCT 34.6* 29.5*    Assessment/Plan: Plan for discharge tomorrow   LOS: 2 days   Cam Hai 11/09/2012, 7:41 AM

## 2012-11-10 ENCOUNTER — Encounter (HOSPITAL_COMMUNITY): Payer: Self-pay | Admitting: *Deleted

## 2012-11-10 MED ORDER — LABETALOL HCL 300 MG PO TABS: 300.0000 mg | ORAL_TABLET | Freq: Two times a day (BID) | ORAL | Status: DC

## 2012-11-10 MED ORDER — OXYCODONE-ACETAMINOPHEN 5-325 MG PO TABS: 1.0000 | ORAL_TABLET | ORAL | Status: DC | PRN

## 2012-11-10 MED ORDER — IBUPROFEN 600 MG PO TABS: 600.0000 mg | ORAL_TABLET | Freq: Four times a day (QID) | ORAL | Status: DC

## 2012-11-10 NOTE — Progress Notes (Signed)
Discharge instructions reviewed with patient.  Patient states understanding of home care, signs/symptoms to seek help for or to report to MD, medications, activity and return MD office visit.  No home equipment needed.  Patient ambulated to NICU in stable condition and will discharge home with baby from NICU.

## 2012-11-10 NOTE — Discharge Summary (Addendum)
Obstetric Discharge Summary  32 y.o. [phone] at [redacted]w[redacted]d with A2 GDM, CHTN and superimposed severe preeclampsia, 6/8 BPP, syncopal episodes.  Patient was induced and had an uncomplicated SVD, please see delivery note for more details.  She was on magnesium sulfate during her induction and remained on this for 24 hours postpartum.  Her  BP was managed with labetalol.  On PPD#2, she was deemed stable for discharge to home.    Prenatal Procedures: NST, Preeclampsia treatment and ultrasound Intrapartum Procedures: spontaneous vaginal delivery Postpartum Procedures: none Complications-Operative and Postpartum: 1st degree perineal laceration, repaired. Hemoglobin  Date Value Range Status  11/09/2012 9.5* 12.0 - 15.0 g/dL Final     HCT  Date Value Range Status  11/09/2012 29.5* 36.0 - 46.0 % Final    Physical Exam:  Temp:  [98.2 F (36.8 C)-99 F (37.2 C)] 98.2 F (36.8 C) (03/02 0153) Pulse Rate:  [92-99] 92 (03/02 0153) Resp:  [16-18] 17 (03/02 0153) BP: (122-153)/(67-95) 122/81 mmHg (03/02 0153) SpO2:  [96 %-100 %] 99 % (03/02 0153) Weight:  [253 lb 12 oz (115.1 kg)] 253 lb 12 oz (115.1 kg) (03/02 1914) General: alert and no distress Lochia: appropriate Uterine Fundus: firm DVT Evaluation: No evidence of DVT seen on physical exam. Negative Homan's sign. No cords or calf tenderness.  Discharge Diagnoses: Term Pregnancy-delivered and GDM; CHTN with superimposed severe preeclampsia  Discharge Information: Date: 11/10/2012 Activity: unrestricted Diet: routine Medications: PNV, Colace, Percocet and labetalol Condition: stable Instructions: refer to instructions Discharge to: home   Newborn Data: Live born female  Birth Weight: 7 lb 11 oz (3487 g) APGAR: 7, 9  Baby was admitted to NICU for glycemic control issues and is expected to home with mother or stay one additional day.  ANYANWU,UGONNA A 11/10/2012, 8:54 AM

## 2012-11-11 ENCOUNTER — Other Ambulatory Visit: Payer: Self-pay

## 2012-11-11 LAB — TYPE AND SCREEN
ABO/RH(D): A POS
Antibody Screen: NEGATIVE
Unit division: 0

## 2012-11-11 NOTE — Progress Notes (Signed)
Post dc ur chart review completed. 

## 2012-11-12 ENCOUNTER — Inpatient Hospital Stay (HOSPITAL_COMMUNITY): Admission: RE | Admit: 2012-11-12 | Payer: Self-pay | Source: Ambulatory Visit

## 2012-11-13 ENCOUNTER — Ambulatory Visit (INDEPENDENT_AMBULATORY_CARE_PROVIDER_SITE_OTHER): Payer: Self-pay | Admitting: General Practice

## 2012-11-13 VITALS — BP 150/99 | HR 88 | Ht 66.0 in | Wt 243.5 lb

## 2012-11-13 DIAGNOSIS — Z0130 Encounter for examination of blood pressure without abnormal findings: Secondary | ICD-10-CM

## 2012-11-13 NOTE — H&P (Signed)
Pt seen and examined Agree with above note.  Latasha Liz H. 11/13/2012 12:33 PM

## 2012-11-13 NOTE — Progress Notes (Signed)
Pt here 6 days post partum for blood pressure check; called Dr Macon Large for BP of 150/99,  She stated she was fine with the blood pressure and to have the patient continue to take her blood pressure medication and return/call the clinic should she start to feel worse/symptoms of high blood pressure. Reviewed these instructions with the patient and patient verbalized understanding.

## 2012-11-14 ENCOUNTER — Ambulatory Visit: Payer: Self-pay

## 2012-11-14 ENCOUNTER — Telehealth: Payer: Self-pay | Admitting: *Deleted

## 2012-11-14 NOTE — Telephone Encounter (Signed)
[employer] RN- Insurance claims handler) called and left message that she had seen pt today for BP check and it was 160/100. Pt continues to take Labetalol 300mg  po BID and has occasional H/A's. Pt was seen @ clinic yesterday with BP of 150/99 which was reported to Dr. Macon Large and no change to plan of care was made at the time. Report of today's BP given to Dr. Erin Fulling- she recommends pt to come for BP check in 2 weeks. I called pt and left message that she will need BP check on or about 11/28/12. Please call clinic to schedule this appt.

## 2012-11-21 NOTE — Progress Notes (Signed)
NST borderline reactive.  Pt sent to MAU for further evaluation and induction later that evening.

## 2012-12-09 NOTE — Telephone Encounter (Signed)
Patient has since delivered.  Disregard previous message due to date of original call in 07/2012.  Gaylene Brooks, RN

## 2012-12-18 ENCOUNTER — Encounter: Payer: Self-pay | Admitting: Obstetrics & Gynecology

## 2012-12-18 ENCOUNTER — Ambulatory Visit (INDEPENDENT_AMBULATORY_CARE_PROVIDER_SITE_OTHER): Payer: Self-pay | Admitting: Obstetrics & Gynecology

## 2012-12-18 NOTE — Progress Notes (Signed)
  Subjective:     Latasha Simmons is a 72 y.[id number] female who presents for a postpartum visit. She is 6 weeks postpartum following a spontaneous vaginal delivery. I have fully reviewed the prenatal and intrapartum course; com[plicated by [facility] with superimposed preeclampsia and GDM. The delivery was at 38 gestational weeks. Anesthesia: epidural. Postpartum course has been uncomplicated. Baby's course has been uncomplicated. Baby is feeding by both breast and bottle. Bleeding no bleeding. Bowel function is normal. Bladder function is normal. Patient is not sexually active. Contraception method is Depo-Provera injections. Postpartum depression screening: negative.  The following portions of the patient's history were reviewed and updated as appropriate: allergies, current medications, past family history, past medical history, past social history, past surgical history and problem list.  Review of Systems Pertinent items are noted in HPI.   Objective:    BP 129/91  Pulse 75  Ht 5\' 6"  (1.676 m)  Wt 233 lb 9.6 oz (105.96 kg)  BMI 37.72 kg/m2  Breastfeeding? Yes  General:  alert and no distress   Breasts:  inspection negative, no nipple discharge or bleeding, no masses or nodularity palpable  Lungs: clear to auscultation bilaterally  Heart:  regular rate and rhythm  Abdomen: soft, non-tender; bowel sounds normal; no masses,  no organomegaly   Vulva:  normal  Vagina: normal vagina  Cervix:  multiparous appearance  Corpus: normal size, contour, position, consistency, mobility, non-tender  Adnexa:  normal adnexa and no mass, fullness, tenderness  Rectal Exam: Not performed.        Assessment:   Normal postpartum exam. Pap smear not done at today's visit.  Plan:   1. Contraception: [facility]-Provera [facility] 2. Follow up in: 1 month for 2 hour GTTor as needed.

## 2013-01-03 ENCOUNTER — Other Ambulatory Visit: Payer: Self-pay

## 2013-01-03 DIAGNOSIS — O99814 Abnormal glucose complicating childbirth: Secondary | ICD-10-CM

## 2013-01-04 LAB — GLUCOSE TOLERANCE, 2 HOURS W/ 1HR: Glucose, 2 hour: 105 mg/dL (ref 70–139)

## 2013-07-17 ENCOUNTER — Other Ambulatory Visit: Payer: Self-pay

## 2013-10-07 NOTE — Telephone Encounter (Signed)
This encounter was created in error - please disregard.

## 2014-02-02 ENCOUNTER — Emergency Department (HOSPITAL_COMMUNITY)
Admission: EM | Admit: 2014-02-02 | Discharge: 2014-02-02 | Disposition: A | Discharge status: Home / Self Care | Payer: Self-pay | Attending: Emergency Medicine | Admitting: Emergency Medicine

## 2014-02-02 ENCOUNTER — Encounter (HOSPITAL_COMMUNITY): Payer: Self-pay | Admitting: Emergency Medicine

## 2014-02-02 DIAGNOSIS — R011 Cardiac murmur, unspecified: Secondary | ICD-10-CM | POA: Insufficient documentation

## 2014-02-02 DIAGNOSIS — H00019 Hordeolum externum unspecified eye, unspecified eyelid: Secondary | ICD-10-CM | POA: Insufficient documentation

## 2014-02-02 DIAGNOSIS — Z862 Personal history of diseases of the blood and blood-forming organs and certain disorders involving the immune mechanism: Secondary | ICD-10-CM | POA: Insufficient documentation

## 2014-02-02 DIAGNOSIS — L0201 Cutaneous abscess of face: Secondary | ICD-10-CM | POA: Insufficient documentation

## 2014-02-02 DIAGNOSIS — L0291 Cutaneous abscess, unspecified: Secondary | ICD-10-CM

## 2014-02-02 DIAGNOSIS — L03211 Cellulitis of face: Secondary | ICD-10-CM

## 2014-02-02 DIAGNOSIS — E119 Type 2 diabetes mellitus without complications: Secondary | ICD-10-CM | POA: Insufficient documentation

## 2014-02-02 DIAGNOSIS — Z91040 Latex allergy status: Secondary | ICD-10-CM | POA: Insufficient documentation

## 2014-02-02 DIAGNOSIS — Z8742 Personal history of other diseases of the female genital tract: Secondary | ICD-10-CM | POA: Insufficient documentation

## 2014-02-02 DIAGNOSIS — Z79899 Other long term (current) drug therapy: Secondary | ICD-10-CM | POA: Insufficient documentation

## 2014-02-02 DIAGNOSIS — I10 Essential (primary) hypertension: Secondary | ICD-10-CM | POA: Insufficient documentation

## 2014-02-02 DIAGNOSIS — Z3202 Encounter for pregnancy test, result negative: Secondary | ICD-10-CM | POA: Insufficient documentation

## 2014-02-02 DIAGNOSIS — L988 Other specified disorders of the skin and subcutaneous tissue: Secondary | ICD-10-CM | POA: Insufficient documentation

## 2014-02-02 DIAGNOSIS — N390 Urinary tract infection, site not specified: Secondary | ICD-10-CM | POA: Insufficient documentation

## 2014-02-02 LAB — URINALYSIS, ROUTINE W REFLEX MICROSCOPIC
BILIRUBIN URINE: NEGATIVE
GLUCOSE, UA: NEGATIVE mg/dL
Ketones, ur: NEGATIVE mg/dL
Leukocytes, UA: NEGATIVE
Nitrite: NEGATIVE
Protein, ur: NEGATIVE mg/dL
SPECIFIC GRAVITY, URINE: 1.032 — AB (ref 1.005–1.030)
UROBILINOGEN UA: 1 mg/dL (ref 0.0–1.0)
pH: 6 (ref 5.0–8.0)

## 2014-02-02 LAB — URINE MICROSCOPIC-ADD ON

## 2014-02-02 LAB — POC URINE PREG, ED: PREG TEST UR: NEGATIVE

## 2014-02-02 MED ORDER — ACETAMINOPHEN 500 MG PO TABS: 500.0000 mg | ORAL_TABLET | Freq: Four times a day (QID) | ORAL | Status: AC | PRN

## 2014-02-02 MED ORDER — CEPHALEXIN 500 MG PO CAPS: 500.0000 mg | ORAL_CAPSULE | Freq: Two times a day (BID) | ORAL | Status: AC

## 2014-02-02 NOTE — ED Provider Notes (Signed)
CSN: [id number]     Arrival date & time 02/02/14  1604 History  This chart was scribed for non-physician practitioner Harvie Heck, PA-C working with Jasper Riling. Alvino Chapel, MD by Eston Mould, ED Scribe. This patient was seen in room TR04C/TR04C and the patient's care was started at 5:55 PM .   Chief Complaint  Patient presents with  . Eye Pain   HPI Comments: Latasha Simmons is a 33 y.o. female who presents to the Emergency Department complaining of L eye pain, redness, swelling and blurred vision that began yesterday. Reports having an increase of watery discharge from L eye but denies any other drainage. She reports applying a warm compress and Ibuprofen today without relief. She also c/o having an abscess to deep R nare and superficial white head to R cheek. Patient states Left lid swelling, right cheek lesion, right near lesion onset at the same time.  She reports applying a warm compress to left eye lid without full resolution of symptoms. She also c/o frequency and dysuria. Is monogamous with her husband. Reports to be breast-feeding her 50 month old. States she has implant for [facility]. NO PCP.  The history is provided by the patient. No language interpreter was used.   Past Medical History  Diagnosis Date  . Hypertension   . Anemia   . Heart murmur   . Morbid obesity   . Fibroid   . Diabetes mellitus without complication   . History of preterm delivery, currently pregnant 08/12/2012  . Gestational diabetes    Past Surgical History  Procedure Laterality Date  . Vaginal delivery      x 3  . No past surgeries     Family History  Problem Relation Age of Onset  . Other Neg Hx    History  Substance Use Topics  . Smoking status: Never Smoker   . Smokeless tobacco: Never Used  . Alcohol Use: No   OB History   Grav Para Term Preterm Abortions TAB SAB Ect Mult Living   4 4 2 2      4      Review of Systems  Constitutional: Negative for fever and chills.  HENT:  Positive for facial swelling.   Eyes: Positive for pain and discharge.  Gastrointestinal: Negative for abdominal pain.  Genitourinary: Positive for dysuria and frequency. Negative for flank pain and vaginal discharge.  Skin: Positive for color change.  All other systems reviewed and are negative.   Allergies  Kiwi extract and Latex  Home Medications   Prior to Admission medications   Medication Sig Start Date End Date Taking? Authorizing Provider  labetalol (NORMODYNE) 100 MG tablet Take 3 tablets (300 mg total) by mouth 2 (two) times daily. 11/04/12 11/04/13  Guss Bunde, MD   BP 147/82  Pulse 81  Temp(Src) 99.1 F (37.3 C) (Oral)  Resp 18  Ht 5' 6.5" (1.689 m)  Wt 237 lb (107.502 kg)  BMI 37.68 kg/m2  SpO2 99%  Physical Exam  Nursing note and vitals reviewed. Constitutional: She is oriented to person, place, and time. She appears well-developed and well-nourished.  Non-toxic appearance. She does not have a sickly appearance. She does not appear ill. No distress.  HENT:  Head: Normocephalic and atraumatic.  White head noted to right nare, with associated mild swelling to R nare. No overlying erythema.  Eyes: Conjunctivae and EOM are normal. Pupils are equal, round, and reactive to light. Left eye exhibits hordeolum.  Hordeolum noted to the left upper lateral  edge of left eyelid margin. Mild amount of swelling to the left eyelid. There is a moderate amount of discharge coming from the left eye lid.  Neck: Normal range of motion. Neck supple.  Cardiovascular: Normal rate.   Pulmonary/Chest: Effort normal. No respiratory distress.  Musculoskeletal: Normal range of motion.  Neurological: She is alert and oriented to person, place, and time.  Skin: Skin is warm and dry. She is not diaphoretic.  White head to the R cheek.  Psychiatric: She has a normal mood and affect. Her behavior is normal.   ED Course  Procedures (including critical care time) 6:23 PM- INCISION AND  DRAINAGE Performed by: Harvie Heck, PA-C Consent: Verbal consent obtained. Risks and benefits: risks, benefits and alternatives were discussed  Sterile Prep and Drape  Type: abscess  Body area: Right nare  Anesthesia: local  Local anesthetic: lidocaine 2 % without epinephrine  Anesthetic total: 1.5 ml  Incision: 11 Blade  Complexity: complex Blunt dissection to breakup loculations  Drainage: Purulent and bloody   Drainage amount: Minimal  Patient tolerance: Patient tolerated the procedure well with no immediate complications.   Labs Review Labs Reviewed  URINALYSIS, ROUTINE W REFLEX MICROSCOPIC - Abnormal; Notable for the following:    APPearance CLOUDY (*)    Specific Gravity, Urine 1.032 (*)    Hgb urine dipstick MODERATE (*)    All other components within normal limits  URINE MICROSCOPIC-ADD ON - Abnormal; Notable for the following:    Squamous Epithelial / LPF MANY (*)    Bacteria, UA FEW (*)    All other components within normal limits  POC URINE PREG, ED    Imaging Review No results found.   EKG Interpretation None     MDM   Final diagnoses:  Hordeolum  Abscess  UTI (lower urinary tract infection)   A hordeolum one to left eyelid, abscess to right near she also has a white head to her right cheek. I&D performed.She also complains of for dysuria, no vaginal discharge or discomfort, reports sexually active with her husband. UA shows many bacteria, moderate hemoglobin and many squamous. Likely contaminate but will treat with Keflex 2 to breast-feeding. Culture sent. Discussed lab results, and treatment plan with the patient. Urged patient to follow up with PCP and women's health for further evaluation of her symptoms. Return precautions given. Reports understanding and no other concerns at this time.  Patient is stable for discharge at this time.  Meds given in ED:  Medications - No data to display  Discharge Medication List as of 02/02/2014  8:07  PM    START taking these medications   Details  acetaminophen (TYLENOL) 500 MG tablet Take 1 tablet (500 mg total) by mouth every 6 (six) hours as needed., Starting 02/02/2014, Until Discontinued, Print    cephALEXin (KEFLEX) 500 MG capsule Take 1 capsule (500 mg total) by mouth 2 (two) times daily., Starting 02/02/2014, Until Discontinued, Print       I personally performed the services described in this documentation, which was scribed in my presence. The recorded information has been reviewed and is accurate.    Lorrine Kin, PA-C 02/03/14 [phone]

## 2014-02-02 NOTE — ED Notes (Signed)
Pt reports left eye pain, redness, swelling and blurred vision. No injury to eye.

## 2014-02-02 NOTE — Discharge Instructions (Signed)
Call for a follow up appointment with a Family or Primary Care Provider.  Return if Symptoms worsen.   Take medication as prescribed.  Drink plenty of fluids, wipe front to back after urination or any bowel movement, and urinate after intercourse. Take Tylenol for pain.

## 2014-02-03 NOTE — ED Provider Notes (Signed)
Medical screening examination/treatment/procedure(s) were performed by non-physician practitioner and as supervising physician I was immediately available for consultation/collaboration.   EKG Interpretation None       Layce Sprung R. Orpah Hausner, MD 02/03/14 [id number] 

## 2014-02-04 LAB — URINE CULTURE
Colony Count: >=100000
Special Requests: NORMAL

## 2014-06-22 IMAGING — US US FETAL BPP W/O NONSTRESS
1 series · 9 of 9 positions shown · non-contrast
Comparison: none

[Series 1: us fetal bpp w/o nonstress · non-contrast · 9 acquisitions, 9 frames shown]
[im 1/9]
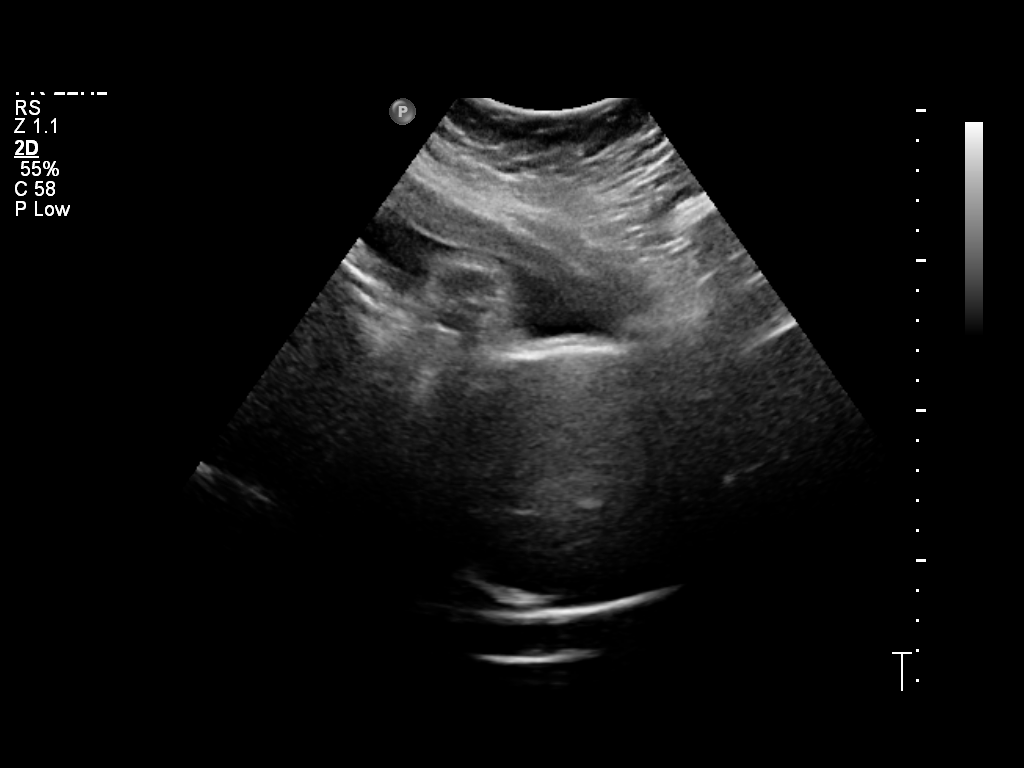
[im 2/9]
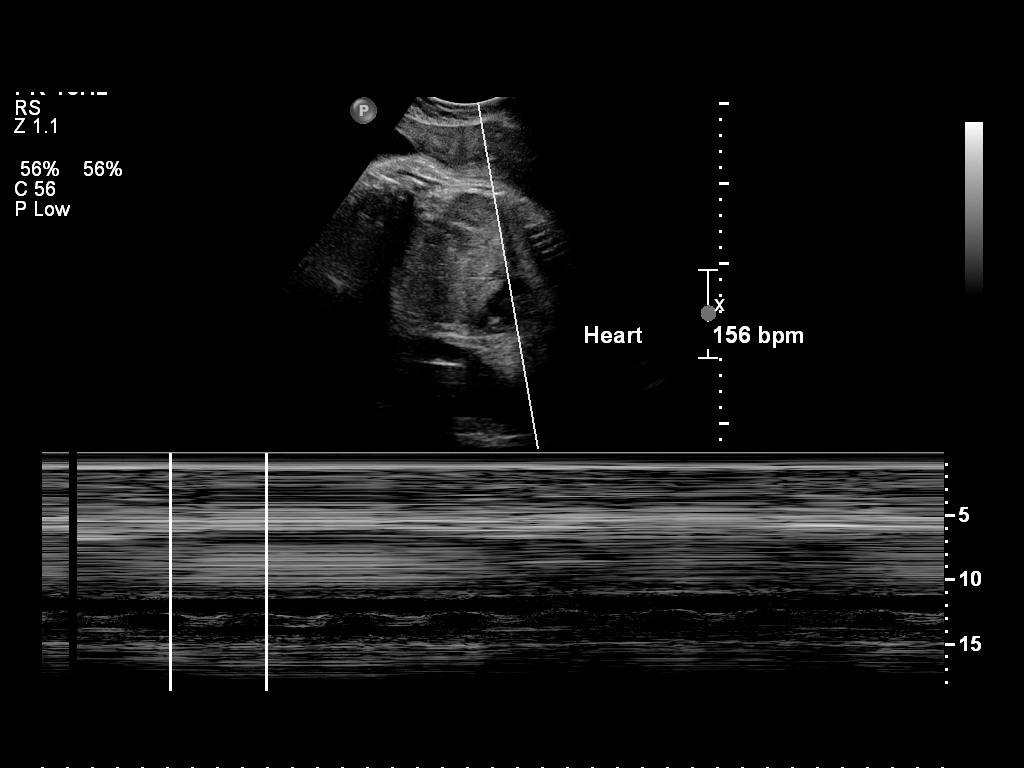
[im 3/9]
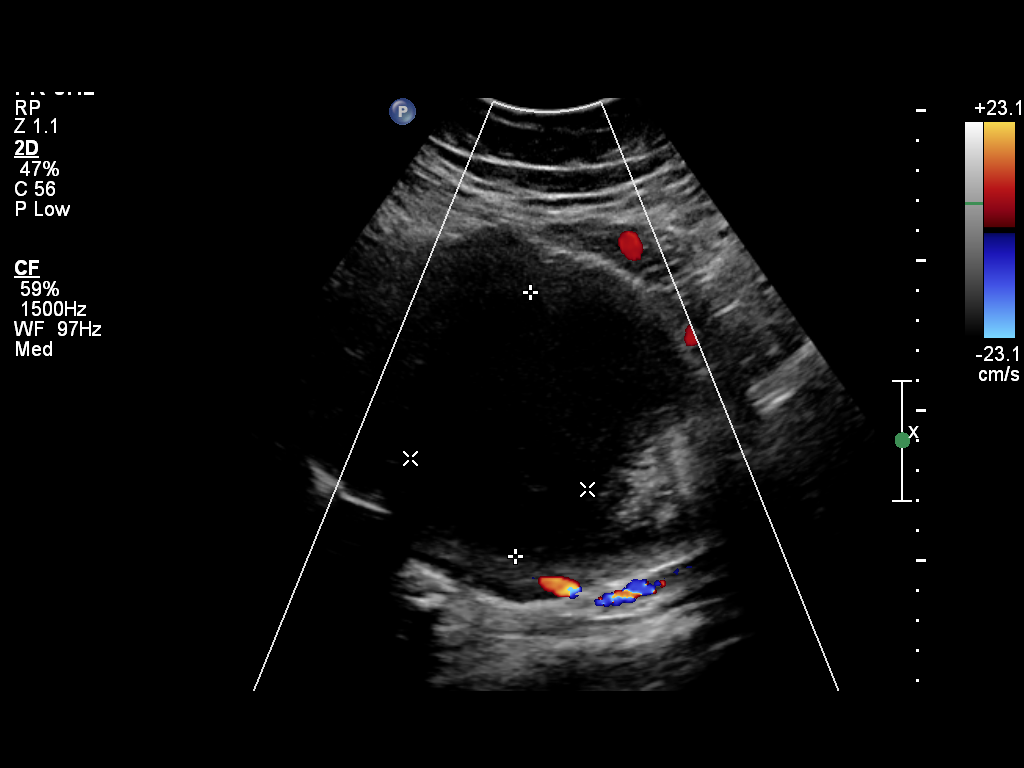
[im 4/9]
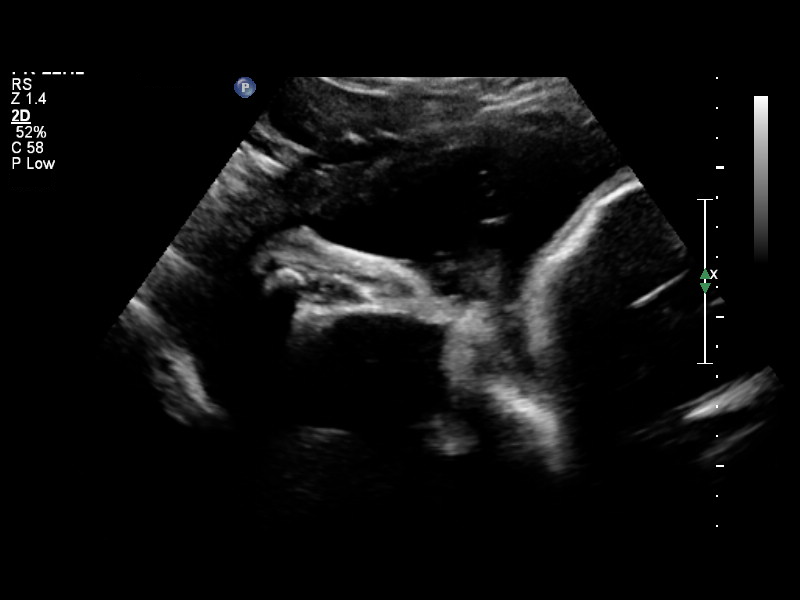
[im 5/9]
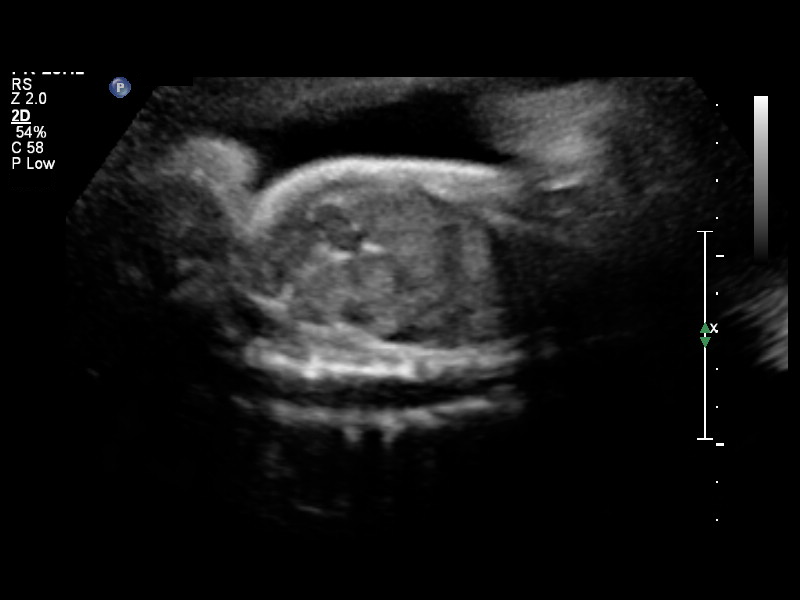
[im 6/9]
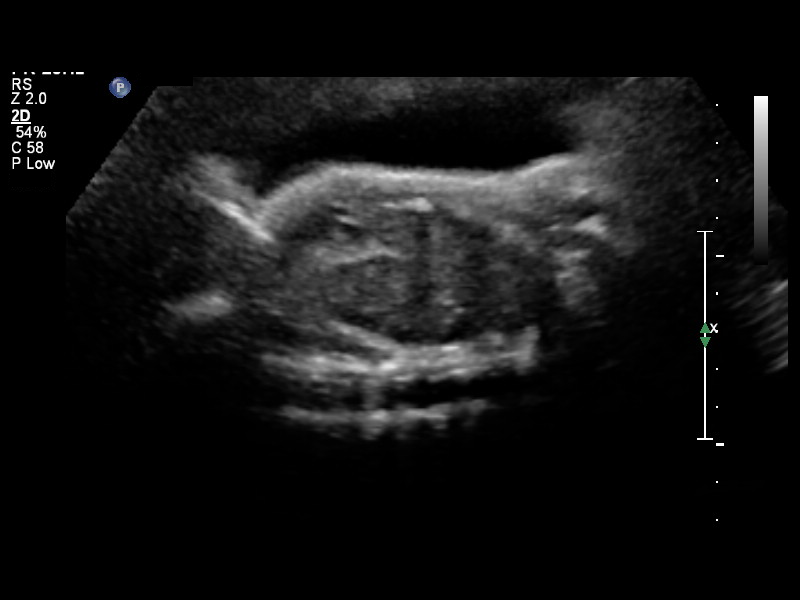
[im 7/9]
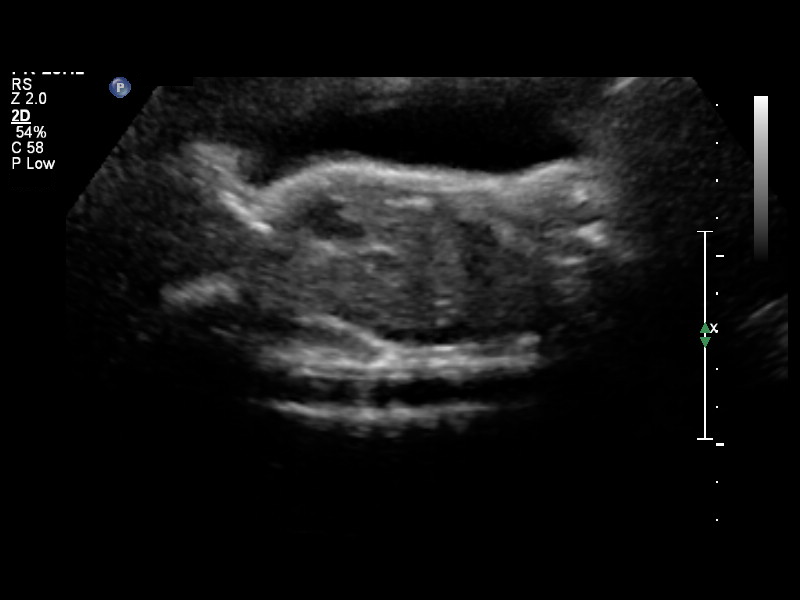
[im 8/9]
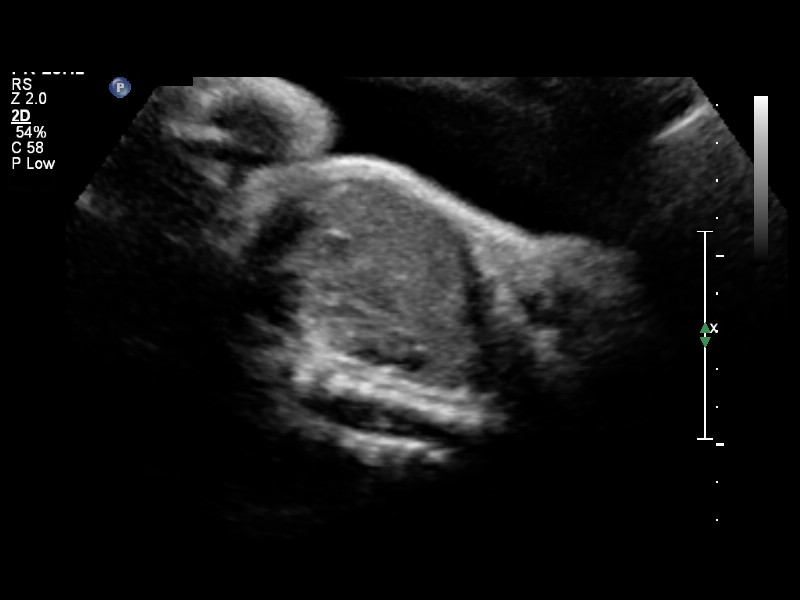
[im 9/9]
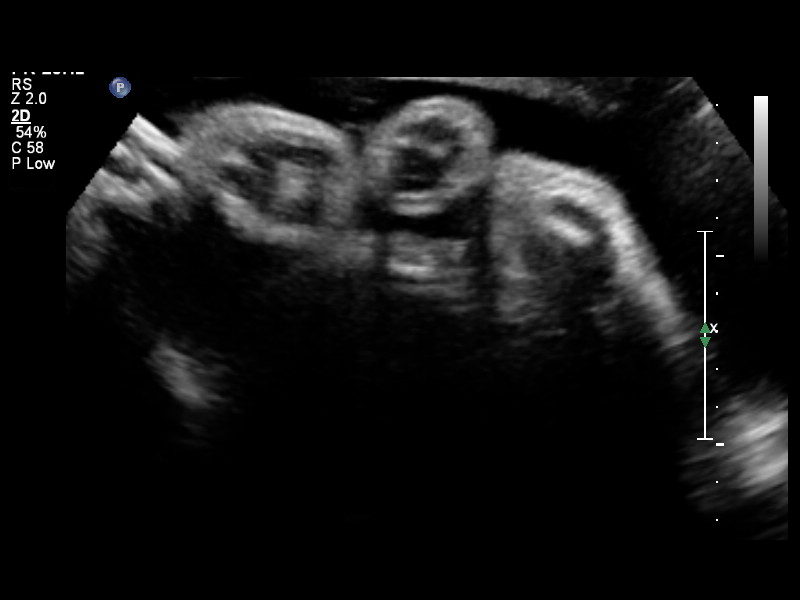

[9 of 9 positions shown; findings below may reference images not displayed]

OBSTETRICS REPORT
                      (Signed Final 10/07/2012 [DATE])

             DOGO

Service(s) Provided

Indications

 Diabetes - Gestational, A1 (diet controlled)
 Hypertension - Chronic/Pre-existing
 Poor obstetric history: Previous preterm delivery 34
 weeks x 2
 Marginal Cord Insertion
 Maternal morbid obesity
 Non-reactive NST, FHR decelerations
Fetal Evaluation

 Num Of Fetuses:    1
 Fetal Heart Rate:  156                         bpm
 Cardiac Activity:  Observed
 Presentation:      Cephalic

 Amniotic Fluid
 AFI FV:      Subjectively within normal limits
                                             Larg Pckt:     8.8  cm
Biophysical Evaluation

 Amniotic F.V:   Within normal limits       F. Tone:        Observed
 F. Movement:    Observed                   Score:          [DATE]
 F. Breathing:   Observed
Gestational Age

 Clinical EDD:  33w 6d                                        EDD:   11/19/12
 Best:          33w 6d    Det. By:   Clinical EDD             EDD:   11/19/12
Impression

 Single living intrauterine pregnancy in cephalic presentation.
 The estimated gestational age is 33w 6d based on Clinical
 EDD . Biophysical profile is [DATE]. Amniotic fluid volume is
 subjectively normal.
 Quirijn-Bayryam with us.  Please do not hesitate to

## 2014-07-13 ENCOUNTER — Encounter (HOSPITAL_COMMUNITY): Payer: Self-pay | Admitting: Emergency Medicine

## 2015-03-08 ENCOUNTER — Other Ambulatory Visit: Payer: Self-pay
